# Patient Record
Sex: Female | Born: 1990 | Race: White | Hispanic: No | Smoking: Former smoker
Health system: Southern US, Community
[De-identification: ages and names within clinical notes are randomized; demographics above are authoritative.]

## PROBLEM LIST (undated history)

## (undated) DIAGNOSIS — F909 Attention-deficit hyperactivity disorder, unspecified type: Secondary | ICD-10-CM

## (undated) DIAGNOSIS — F419 Anxiety disorder, unspecified: Secondary | ICD-10-CM

## (undated) HISTORY — DX: Attention-deficit hyperactivity disorder, unspecified type: F90.9

## (undated) HISTORY — DX: Anxiety disorder, unspecified: F41.9

## (undated) HISTORY — PX: APPENDECTOMY: SHX54

---
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
## 2008-03-16 ENCOUNTER — Ambulatory Visit: Payer: Self-pay | Admitting: Family Medicine

## 2008-10-12 ENCOUNTER — Ambulatory Visit: Payer: Self-pay | Admitting: Obstetrics & Gynecology

## 2008-10-12 ENCOUNTER — Encounter: Payer: Self-pay | Admitting: Obstetrics & Gynecology

## 2008-10-12 LAB — CONVERTED CEMR LAB
Chlamydia, DNA Probe: NEGATIVE
GC Probe Amp, Genital: NEGATIVE

## 2009-03-22 ENCOUNTER — Emergency Department: Payer: Self-pay | Admitting: Emergency Medicine

## 2009-03-23 ENCOUNTER — Emergency Department: Payer: Self-pay | Admitting: Emergency Medicine

## 2009-12-03 ENCOUNTER — Ambulatory Visit: Payer: Self-pay | Admitting: Obstetrics & Gynecology

## 2009-12-03 LAB — CONVERTED CEMR LAB: Chlamydia, DNA Probe: POSITIVE — AB

## 2009-12-04 ENCOUNTER — Encounter: Payer: Self-pay | Admitting: Obstetrics & Gynecology

## 2009-12-04 LAB — CONVERTED CEMR LAB: Yeast Wet Prep HPF POC: NONE SEEN

## 2010-08-22 ENCOUNTER — Ambulatory Visit: Payer: Self-pay | Admitting: Obstetrics & Gynecology

## 2010-09-12 ENCOUNTER — Emergency Department: Payer: Self-pay | Admitting: Emergency Medicine

## 2010-09-24 NOTE — Assessment & Plan Note (Signed)
NAMEOSHA, RANE             ACCOUNT NO.:  [id number]   MEDICAL RECORD NO.:  [id number]          PATIENT TYPE:  POB   LOCATION:  [facility] at [facility]         FACILITY:  [facility]   PHYSICIAN:  Allie Bossier, MD        DATE OF BIRTH:  May 03, 1991   DATE OF SERVICE:  10/12/2008                                  CLINIC NOTE   Dawn Martinez is a 20 year old single white gravida 0, who is here for her  annual exam.  She has no particular complaints.  She has begun a new  sexual relationship with her current boyfriend for the last 3 months.  She is using condoms and withdrawal condoms and withdrawal for birth  control, but she would like to restart Lo/Ovral (she used this in the  past with without any complications).   PAST MEDICAL HISTORY:  None.   PAST SURGICAL HISTORY:  None.   REVIEW OF SYSTEMS:  As above plus she is a sophomore at [employer].  She has her period monthly and they last 3-4 days,  they are not painful.   FAMILY HISTORY:  Negative for breast and GYN cancer, but maternal  grandfather had colon cancer.   ALLERGIES:  PHENERGAN causes her to hallucinate and Ceclor causes an  unknown reaction.  She took it last as a child.  No LATEX allergies.   MEDICATIONS:  None.   PHYSICAL EXAMINATION:  VITAL SIGNS:  Weight 112 pounds, height 5 feet 1  inches, blood pressure 104/62, pulse 57.  HEENT:  Normal.  BREASTS:  Normal bilaterally.  HEART:  Regular rate and rhythm.  LUNGS:  Clear to auscultation bilaterally.  ABDOMEN:  Benign scaphoid.  No hepatosplenomegaly.  EXTERNAL GENITALIA:  Shaved, no lesions.  Cervix nulliparous, no  lesions.  Normal discharge.  Uterus normal size, retroverted, nontender.  Adnexa nontender.  No masses.   ASSESSMENT AND PLAN:  Annual exam.  I have checked cervical cultures.  She will resume Pap smear screening at age 71 and I will began Lo/Ovral  with her next period.  She will come in for a blood pressure check in  approximately 4  weeks.      Allie Bossier, MD     MCD/MEDQ  D:  10/12/2008  T:  10/13/2008  Job:  [id number]

## 2010-09-24 NOTE — Assessment & Plan Note (Signed)
Dawn Martinez, Dawn Martinez             ACCOUNT NO.:  [id number]   MEDICAL RECORD NO.:  [id number]          PATIENT TYPE:  POB   LOCATION:  [facility] at [facility]         FACILITY:  [facility]   PHYSICIAN:  Jaynie Collins, MD     DATE OF BIRTH:  [birth year]   DATE OF SERVICE:  12/03/2009                                  CLINIC NOTE   CHIEF COMPLAINT:  Dyspareunia and bleeding after intercourse.   HISTORY OF PRESENT ILLNESS:  The patient is a 20 year old gravida 0 who  is here today with a report of dyspareunia and also postcoital bleeding.  The patient does report having abdominal cramping, especially around her  period every month.  This cramping has been alleviated in the past by  using Ponstel and she also says she gets Vicodin from her father which  also helps with her pain.  The patient does also report increased during  intercourse in all positions with her current boyfriend.  She does say  that her boyfriend is well in doubt and he feels like this could be part  of the problem however.  She has had 8 lifetime partners and she feels  that she had other partners that were similarly in doubt, but without  any problems.  She is also complaining a vaginal dryness but has been  using some lubrication to help with this.  The patient wants to have  pain medication to help with her pain during intercourse.  She wants  evaluation for the bleeding after intercourse or during intercourse and  also wants a prescription for birth control pills.  She denies any other  complaints.  Of note, the patient's history is remarkable for using  cigarettes.  She smokes one pack a day and she does admit to a history  of using crack in the past.  Her last Pap smear was in 2010 which was  negative.  She denies any sexually transmitted infections.   PHYSICAL EXAMINATION:  Blood pressure is 104/74, pulse 78, weight 101  pounds, height 5 feet.  In general, no apparent distress.  Abdomen is  soft, nontender,  nondistended.  Pelvic, normal external female  genitalia.  The patient is exquisitely tender on examination of her  introitus area.  She does point to her right introitus area and points  to a hymenal remnant as the area of her pain.  There is no erythema, no  fissure, no lesions seen and she just seems very uncomfortable on  examination.  On speculum exam, she has pink, well-rugated vagina.  No  lesions seen.  Normal discharge.  A sample was taken to rule out  vaginitis.  Her cervical exam was remarkable for some erythema, was  tender to touch.  Sample was taken for gonorrhea and Chlamydia testing.  The patient does say that the majority of her pain is on the outside on  the introitus area.  A swab was taken to rule out herpes simplex virus  and also a Pap smear was obtained given the appearance of her cervix.  Bimanual exam was exquisitely tender, but no abnormal masses were  palpated.   ASSESSMENT AND PLAN:  The patient is a 20 year old gravida 0  here for  evaluation for dyspareunia, postcoital bleeding, and dysmenorrhea.  The  patient was told that there could be different etiologies for her pain,  but at this point vaginitis and serositis needs to be ruled out.  A Pap  smear is also pending.  We will also follow up on the results.  Given  her pattern of bleeding, she also might have a history of endometriosis.  The patient was told that the only way to diagnose this will be through  laparoscopy and this will be a consideration if her symptoms continue.  As management for her pain, the patient was given a prescription for  Voltaren XR which is a strong antiinflammatory agent.  She says she does  one birth control, she was given a pack of LoSeasonique.  The patient is  not interested in having continuous OCPs of periods 4 times a year, she  wants to have a monthly period, so she was instructed on how to take 3  weeks of this and not take anything for a week and then start it again   until she completes the pack.  She was told that she cannot take only  the active pills of the pack until she gets to the end of the pack.  The  patient verbalized understanding of plan.  We will follow up on all  other tests that were done today and the patient was told she will be  contacted with any abnormal results.  In the meantime, she was told to  take her Voltaren as needed for pain and to call or come back in for  further evaluation.  Of note, the patient was very upset at the end of  the encounter as she expected a narcotic prescription for her symptoms.  She was told that the best way to treat it will be to use an  antiinflammatory agent and her affect was also very concerning at the  end of this encounter.  If she does come back for reevaluation, this  issue might need to be readdressed.           ______________________________  Jaynie Collins, MD     UA/MEDQ  D:  12/03/2009  T:  12/04/2009  Job:  [id number]

## 2012-09-02 LAB — HM PAP SMEAR: HM Pap smear: NEGATIVE

## 2012-09-05 LAB — COMPREHENSIVE METABOLIC PANEL
Calcium, Total: 8.7 mg/dL (ref 8.5–10.1)
EGFR (African American): >60
EGFR (Non-African Amer.): >60
Osmolality: 282 (ref 275–301)
SGOT(AST): 18 U/L (ref 15–37)
SGPT (ALT): 20 U/L (ref 12–78)
Sodium: 141 mmol/L (ref 136–145)
Total Protein: 6.9 g/dL (ref 6.4–8.2)

## 2012-09-05 LAB — LIPASE, BLOOD: Lipase: 89 U/L (ref 73–393)

## 2012-09-05 LAB — CBC
HCT: 37.4 % (ref 35.0–47.0)
MCH: 29.9 pg (ref 26.0–34.0)
Platelet: 238 10*3/uL (ref 150–440)

## 2012-09-06 ENCOUNTER — Observation Stay: Payer: Self-pay | Admitting: Surgery

## 2012-09-06 LAB — URINALYSIS, COMPLETE
Bacteria: NONE SEEN
Bilirubin,UR: NEGATIVE
Blood: NEGATIVE
Glucose,UR: NEGATIVE mg/dL (ref 0–75)
Nitrite: NEGATIVE

## 2012-09-06 LAB — PREGNANCY, URINE: Pregnancy Test, Urine: NEGATIVE m[IU]/mL

## 2012-09-07 LAB — PATHOLOGY REPORT

## 2014-03-23 IMAGING — CT CT ABD-PELV W/ CM
1 of 2 series · 15 of 32 positions shown, 19 images · IV contrast (isovue)
Comparison: none

REASON FOR EXAM: (1) right sided abd pain with elevated WBC eval appy;
(2) right sided abd pain w
COMMENTS:

PROCEDURE:     CT  - CT ABDOMEN / PELVIS  W  - September 06, 2012  [DATE]
RESULT:     Comparison:  None
TECHNIQUE: Multiple axial images of the abdomen and pelvis were performed
from the lung bases to the pubic symphysis, without p.o. contrast and with
85 mL of Isovue 300 intravenous contrast.

[Series 2: 3mm soft tissue · axial · 0.60mm/px · z∈[-453,-60]mm · 15 of 143 slices shown, 19 images]
[im 6/143  soft-tissue]
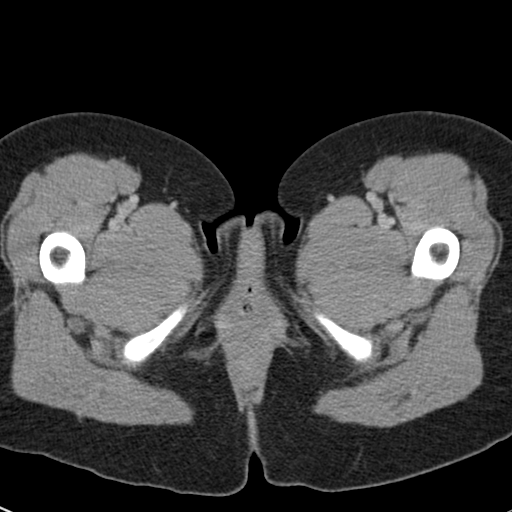
[im 6/143  bone]
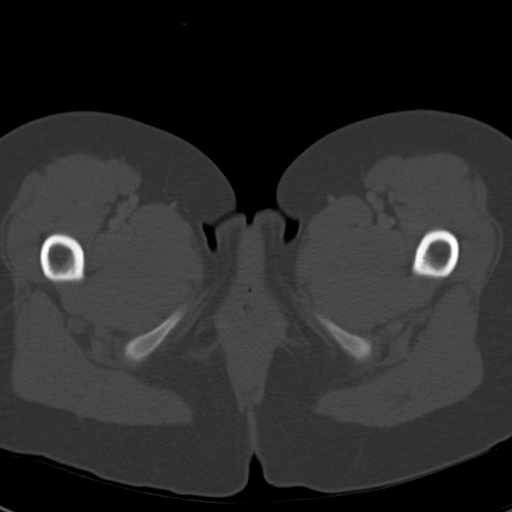
[im 18/143  soft-tissue]
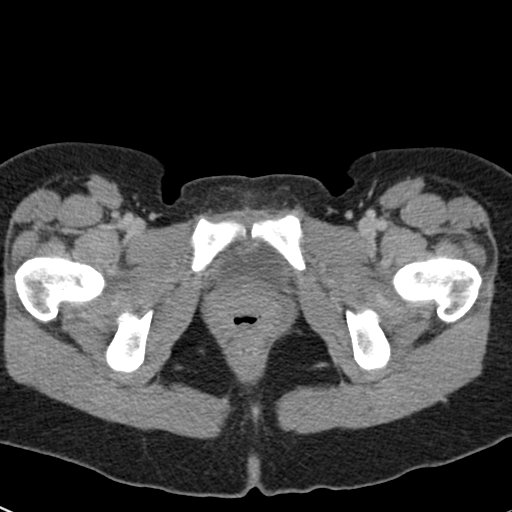
[im 30/143  soft-tissue]
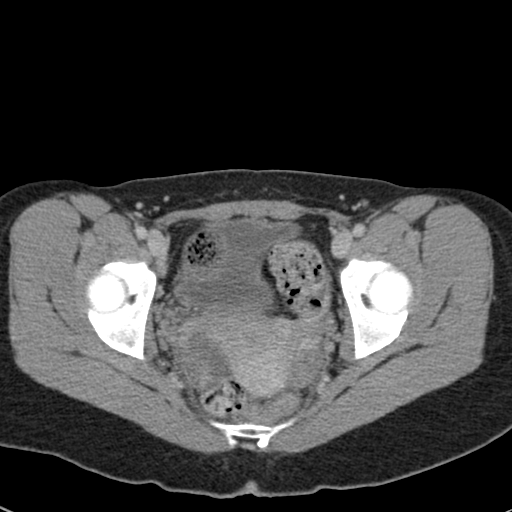
[im 42/143  soft-tissue]
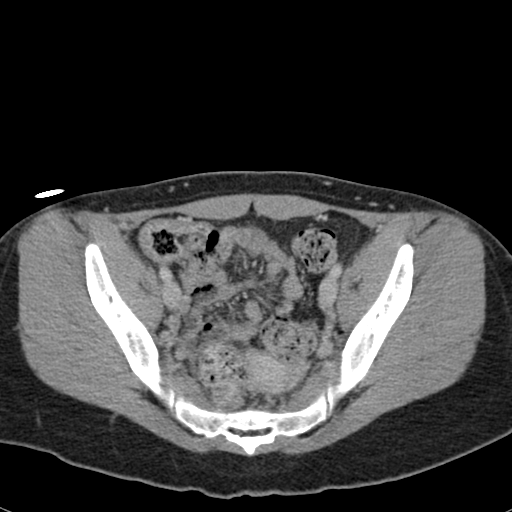
[im 48/143  soft-tissue]
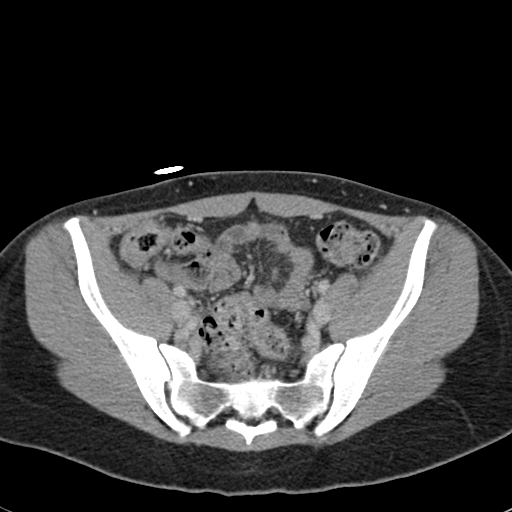
[im 60/143  soft-tissue]
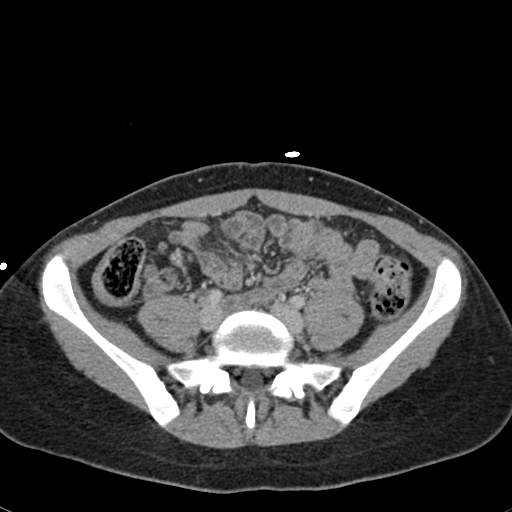
[im 72/143  soft-tissue]
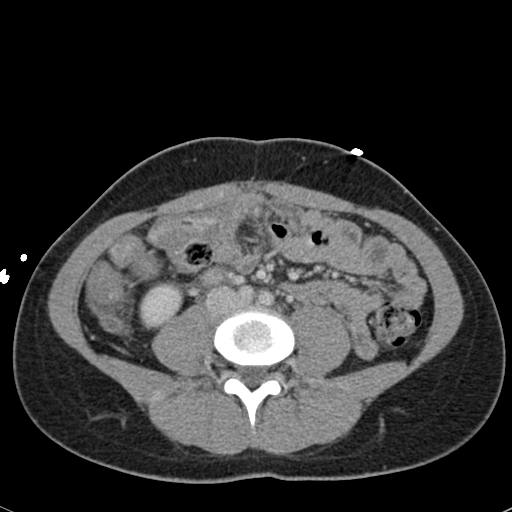
[im 83/143  soft-tissue]
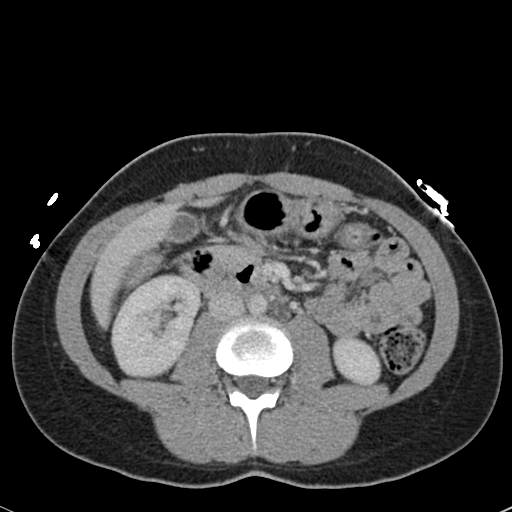
[im 95/143  soft-tissue]
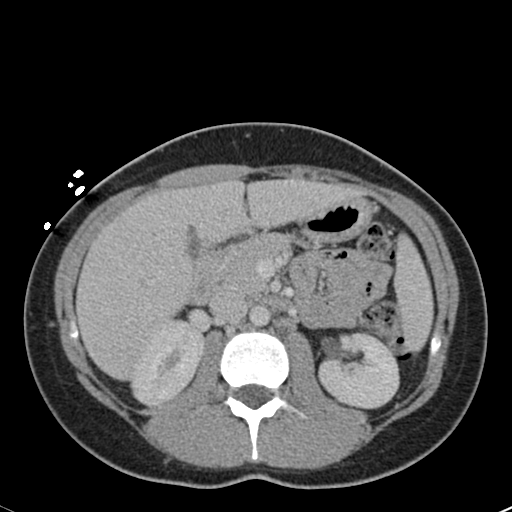
[im 95/143  bone]
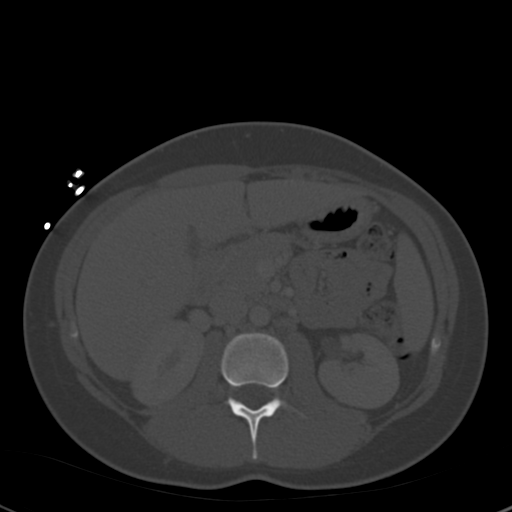
[im 101/143  soft-tissue]
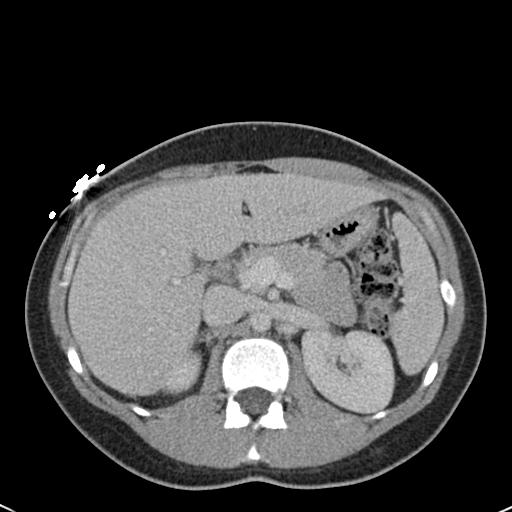
[im 113/143  soft-tissue]
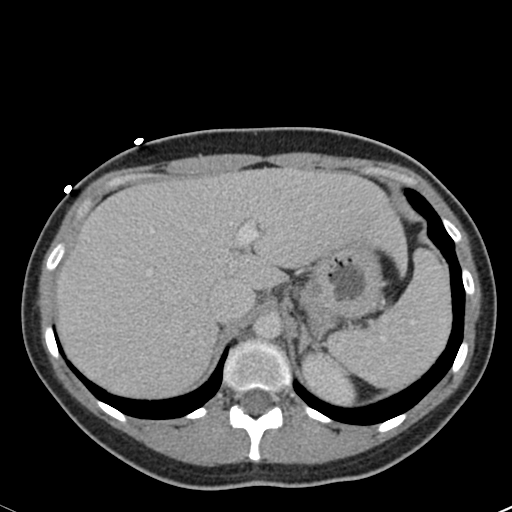
[im 119/143  lung]
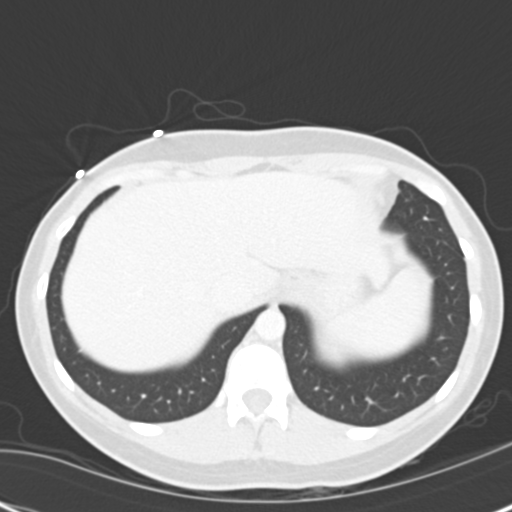
[im 125/143  soft-tissue]
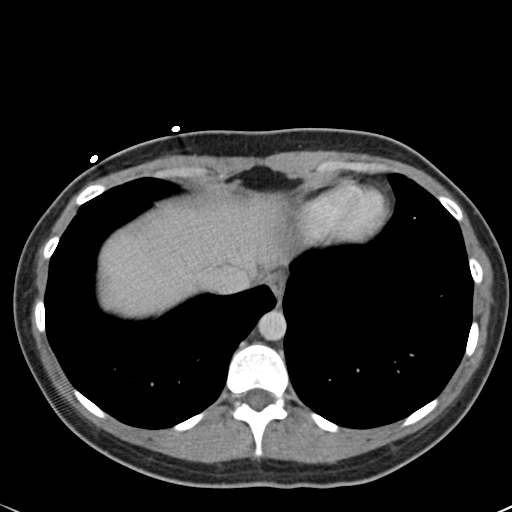
[im 125/143  lung]
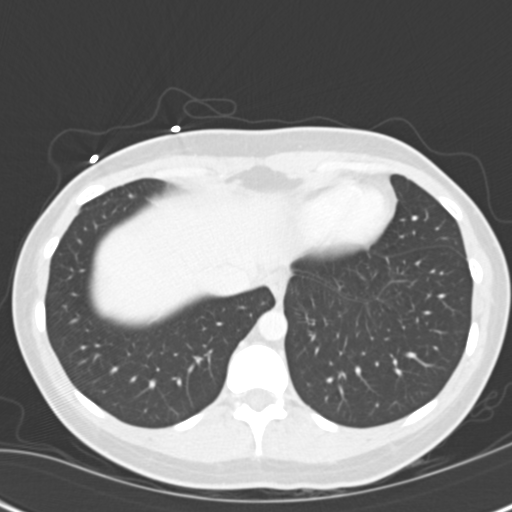
[im 131/143  lung]
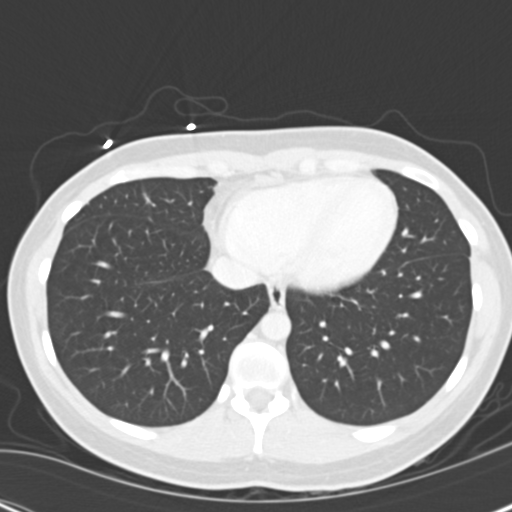
[im 137/143  soft-tissue]
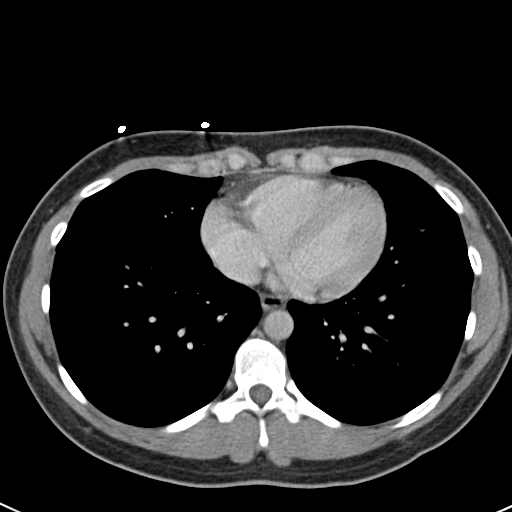
[im 137/143  lung]
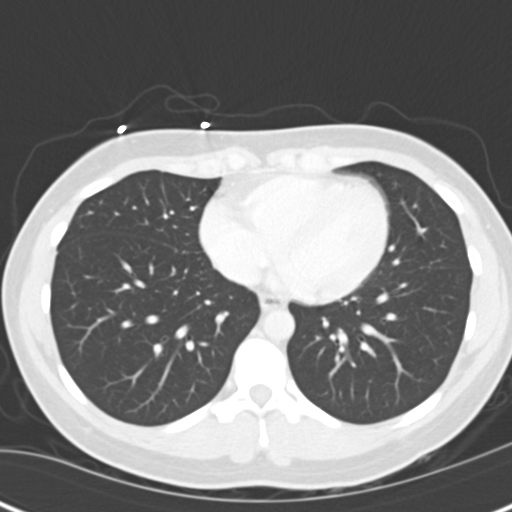

[15 of 32 positions shown; findings below may reference images not displayed]

FINDINGS: The liver, gallbladder, spleen, adrenals, and pancreas are unremarkable. The
kidneys enhance normally.

The small and large bowel are normal in caliber. There is a trace amount of
free fluid in the pelvis, which is likely physiologic. The appendix is
enlarged and fluid-filled. It measures 11 mm in diameter. There is moderate
adjacent inflammatory stranding. The findings are consistent with acute
appendicitis. Mild prominence of the adjacent ascending colon wall is likely
reactive.

No aggressive lytic or sclerotic osseous lesions are identified.
IMPRESSION: Acute appendicitis.

## 2014-09-01 ENCOUNTER — Emergency Department: Admit: 2014-09-01 | Disposition: A | Discharge status: Home / Self Care | Payer: Self-pay | Admitting: Student

## 2014-09-01 LAB — CBC WITH DIFFERENTIAL/PLATELET
Basophil #: 0.1 10*3/uL (ref 0.0–0.1)
Basophil %: 0.6 %
Eosinophil #: 0.1 10*3/uL (ref 0.0–0.7)
Eosinophil %: 0.8 %
HCT: 37.2 % (ref 35.0–47.0)
HGB: 12.4 g/dL (ref 12.0–16.0)
LYMPHS PCT: 21.5 %
Lymphocyte #: 3.9 10*3/uL — ABNORMAL HIGH (ref 1.0–3.6)
MCH: 29 pg (ref 26.0–34.0)
MCHC: 33.2 g/dL (ref 32.0–36.0)
MCV: 87 fL (ref 80–100)
Monocyte #: 1.3 x10 3/mm — ABNORMAL HIGH (ref 0.2–0.9)
Monocyte %: 7.3 %
NEUTROS ABS: 12.6 10*3/uL — AB (ref 1.4–6.5)
Neutrophil %: 69.8 %
PLATELETS: 293 10*3/uL (ref 150–440)
RBC: 4.26 10*6/uL (ref 3.80–5.20)
RDW: 12.9 % (ref 11.5–14.5)
WBC: 18.1 10*3/uL — AB (ref 3.6–11.0)

## 2014-09-01 NOTE — Op Note (Signed)
PATIENT NAME:  Dawn Martinez, Dawn Martinez MR#:  [id number] DATE OF BIRTH:  [birth year]  DATE OF PROCEDURE: 09/06/2012.   OPERATION PERFORMED: Laparoscopic appendectomy.   PREOPERATIVE DIAGNOSIS: Early acute appendicitis.   POSTOPERATIVE DIAGNOSIS: Early acute appendicitis.   SURGEON: Claude MangesWilliam F Macil Crady, M.D.   ANESTHESIA: General.   PROCEDURE IN DETAIL: The patient was placed supine on the operating room table and prepped and draped in the usual sterile fashion. A 2 cm supraumbilical midline incision was made, carried down through the linea alba and a Hassan cannula was introduced amidst horizontal mattress sutures of 0 Vicryl. A 15 mmHg CO2 pneumoperitoneum was created. There was a small amount of free pus in the pelvis. The cecum was mobile, and the appendix was in a bizarre location, having a mesoappendix attachment to the ascending colon more than halfway up to the hepatic flexure. The area of inflammation in the appendix was at the tip. The mesoappendix was taken down with the Harmonic scalpel very carefully in order to not create any thermal injury to the nearby ascending colon, and the appendectomy was performed with an Endo GIA stapling device. The appendix was placed in an EndoCatch bag and extracted from the abdomen via the epigastric port.   The right upper quadrant and right gutter were irrigated with warm normal saline. This was all suctioned clear from the pelvis, and the peritoneum was desufflated and decannulated and the linea alba was closed with a running 0 PDS suture and the previously-placed Vicryls were tied, one to another, and all 3 skin sites were closed with subcuticular 5-0 Monocryl and suture strips. The patient tolerated the procedure well. There were no complications.    ____________________________ Claude MangesWilliam F. Adaysha Dubinsky, MD wfm:dm D: 09/06/2012 12:07:45 ET T: 09/06/2012 12:49:45 ET JOB#: [id number]  cc: Claude MangesWilliam F. Kursten Kruk, MD, <Dictator> Claude MangesWILLIAM F Eleftherios Dudenhoeffer MD ELECTRONICALLY  SIGNED 09/12/2012 14:44

## 2014-09-01 NOTE — H&P (Signed)
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   Past History PMH none PSH none   Past Med/Surgical Hx:  Denies medical history:   Denies surgical history.:   ALLERGIES:  Phenergan: Alt Ment Status  Ceclor: Unknown  Family and Social History:  Family History Non-Contributory   Social History positive  tobacco, positive ETOH, CNA   + Tobacco Current (within 1 year)   Place of Living Home   Review of Systems:  Fever/Chills No   Cough No   Abdominal Pain Yes   Diarrhea No   Constipation No   Nausea/Vomiting Yes   SOB/DOE No   Chest Pain No   Dysuria No   Tolerating Diet Yes  Nauseated  Vomiting  ate last night, reg diet but vomiting later this eve   Physical Exam:  GEN no acute distress   HEENT pink conjunctivae   NECK supple   RESP normal resp effort  clear BS  no use of accessory muscles   CARD regular rate   ABD positive tenderness  soft  RLQ tenderness, no guarding no rebound no percussion tenderness, no CVA tenderness   LYMPH negative neck   EXTR negative edema   SKIN normal to palpation   PSYCH alert, A+O to time, place, person, good insight   Lab Results: Hepatic:  27-Apr-14 22:34   Bilirubin, Total 0.3  Alkaline Phosphatase 53  SGPT (ALT) 20  SGOT (AST) 18  Total Protein, Serum 6.9  Albumin, Serum 4.2  Routine Chem:  27-Apr-14 22:34   Lipase 89 (Result(s) reported on 05 Sep 2012 at 11:46PM.)  Glucose, Serum  108  BUN 12  Creatinine (comp) 0.65  Sodium, Serum 141  Potassium, Serum  3.2  Chloride, Serum 107  CO2, Serum  20  Calcium (Total), Serum 8.7  Osmolality (calc) 282  eGFR (African American) >60  eGFR (Non-African American) >60 (eGFR values <50m/min/1.73 m2 may be an indication of chronic kidney disease (CKD). Calculated eGFR is useful in  patients with stable renal function. The eGFR calculation will not be reliable in acutely ill patients when serum creatinine is changing rapidly. It is not useful in  patients on dialysis. The eGFR calculation may not be applicable to patients at the low and high extremes of body sizes, pregnant women, and vegetarians.)  Anion Gap 14  Routine UA:  28-Apr-14 00:27   Color (UA) Yellow  Clarity (UA) Hazy  Glucose (UA) Negative  Bilirubin (UA) Negative  Ketones (UA) 1+  Specific Gravity (UA) 1.021  Blood (UA) Negative  pH (UA) 8.0  Protein (UA) Negative  Nitrite (UA) Negative  Leukocyte Esterase (UA) Negative (Result(s) reported on 06 Sep 2012 at 12:52AM.)  RBC (UA) 2 /HPF  WBC (UA) 5 /HPF  Bacteria (UA) NONE SEEN  Epithelial Cells (UA) 2 /HPF  Mucous (UA) PRESENT  Budding Yeast (UA) PRESENT  Amorphous Crystal (UA) PRESENT (Result(s) reported on 06 Sep 2012 at 12:52AM.)  Routine Sero:  28-Apr-14 00:27   Pregnancy Test, Urine NEGATIVE (The results of the qualitative urine HCG (Pregnancy Test) should be evaluated in light of other clinical information.  There are limitations to the test which, in certain clinical situations, may result in a false positive or negative result. Thehigh dose hook effect can occur in urine samples with extremely high HCG concentrations.  This effect can produce a negative result in certain situations. It is suggested that results of the qualitative HCG be confirmed by an alternate methodology, such as the quantitative serum beta HCG test.)  Routine Hem:  27-Apr-14 22:34   WBC (CBC)  24.6  RBC (CBC) 4.28  Hemoglobin (CBC) 12.8  Hematocrit (CBC) 37.4  Platelet Count (CBC) 238 (Result(s) reported on 05 Sep 2012 at 10:52PM.)  MCV 87  MCH 29.9  MCHC 34.2  RDW 12.5    Assessment/Admission Diagnosis CT personally rev'd, appendicitis likely early appy given onset of sx and little peritoneal signs may be retrocecal K3.2, will replace K and plan lap  appy later this am by Dr Leanora Cover risks and options rev'd, see dictation agrees with plan abx and VTE prophylaxis in place   Electronic Signatures: Florene Glen (MD)  (Signed 28-Apr-14 02:28)  Authored: CHIEF COMPLAINT and HISTORY, PAST MEDICAL/SURGIAL HISTORY, ALLERGIES, FAMILY AND SOCIAL HISTORY, REVIEW OF SYSTEMS, PHYSICAL EXAM, LABS, ASSESSMENT AND PLAN   Last Updated: 28-Apr-14 02:28 by Florene Glen (MD)

## 2014-09-01 NOTE — H&P (Signed)
PATIENT NAME:  Dawn Martinez, FRIEDLANDER MR#:  [id number] DATE OF BIRTH:  16-Sep-1990  DATE OF ADMISSION:  09/06/2012  CHIEF COMPLAINT: Right side pain.   HISTORY OF PRESENT ILLNESS: This is a patient with acute onset of right-sided and right lower quadrant pain that started approximately 20 to 30 hours. 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 It has been gradually worsening but is much better now that she has had some pain medications. She was given IV Toradol. She had multiple emeses as well, but she ate dinner last night at approximately 5:30 and had a snack at break prior to the onset of her symptoms.   She had normal bowel movements today and has not had any fevers or chills, has never had an episode like this before, denies dysuria or hematuria.   PAST MEDICAL HISTORY: None.   PAST SURGICAL HISTORY: None.   ALLERGIES: CECLOR CAUSING A RASH WHEN SHE WAS APPROXIMATELY 24 YEARS OLD AND PHENERGAN WHICH CAUSES HER TO HALLUCINATE.   REVIEW OF SYSTEMS: 10 system review is performed and negative with the exception of that mentioned in the history of present illness.   MEDICATIONS: None.   FAMILY HISTORY: Noncontributory.   PHYSICAL EXAMINATION: GENERAL: Healthy female patient. She is walking around the room without pain or tenderness (on questioning she has no increased pain while walking).  VITAL SIGNS: Temperature of 100, pulse of 79, respirations 22, blood pressure 92/79, 100% room air sat. Pain scale was 10 when taken at 2224, not re-questioned except by me. See history of present illness.  HEENT: No scleral icterus.   NECK: No palpable neck nodes.  CHEST: Clear to auscultation.  CARDIAC: Regular rate and rhythm.  ABDOMEN: Soft. There is no guarding, no rebound, and very minimal if any percussion tenderness in the right lower quadrant. No CVA tenderness. Her maximal tenderness is in the right lower quadrant, somewhat lateral to McBurney's point,  again without guarding, rebound or percussion tenderness of any significance.  EXTREMITIES: Without edema.  NEUROLOGIC: Grossly intact.  INTEGUMENT: No jaundice.   LABORATORY, DIAGNOSTIC, AND RADIOLOGICAL DATA: CT scan is personally reviewed suggesting appendicitis possibly in a retrocecal position.   Laboratory values demonstrate a negative per pregnancy test, normal urinalysis, lipase is normal. White blood cell count is 24.6, hemoglobin and hematocrit of 12.8 and 37.4 and a platelet count 238. Potassium is depressed at 3.2, CO2 is 20.   ASSESSMENT AND PLAN: This is a patient with likely appendicitis both by history, physical and CT scan. Likely early appendicitis given the patient's lack of peritoneal findings and the onset of her symptoms.  IV antibiotics have been instituted by Dr. Zenda Alpers in the ER who I spoke to personally. She will be given VTE prophylaxis, admitted and her potassium will need to be replaced prior to general anesthesia. I have instituted orders concerning replacement of her potassium to 3.2 and plan a laparoscopic appendectomy later this morning by Dr. Anda Kraft. The rationale for surgery has been discussed. The options of observation have been reviewed and the risks of bleeding, infection, recurrence, ruptured appendix and the ramifications of such and the risks of an open procedure and abscess have all been reviewed with her and her family. They understood and agreed to proceed.    ____________________________ Adah Salvage Excell Seltzer, MD rec:kb D: 09/06/2012 02:32:29 ET T: 09/06/2012 04:50:56 ET JOB#: [id number]  cc: Adah Salvage. Excell Seltzer, MD, <Dictator> Lattie Haw MD ELECTRONICALLY  SIGNED 09/06/2012 6:44

## 2014-09-02 LAB — URINALYSIS, COMPLETE
BILIRUBIN, UR: NEGATIVE
Glucose,UR: NEGATIVE mg/dL (ref 0–75)
Ketone: NEGATIVE
LEUKOCYTE ESTERASE: NEGATIVE
Nitrite: NEGATIVE
PH: 5 (ref 4.5–8.0)
Specific Gravity: 1.026 (ref 1.003–1.030)

## 2014-09-02 LAB — COMPREHENSIVE METABOLIC PANEL
ALK PHOS: 56 U/L
Albumin: 4.3 g/dL
Anion Gap: 7 (ref 7–16)
BILIRUBIN TOTAL: 0.4 mg/dL
BUN: 10 mg/dL
CALCIUM: 8.9 mg/dL
CO2: 27 mmol/L
Chloride: 103 mmol/L
Creatinine: 0.69 mg/dL
EGFR (African American): >60
Glucose: 97 mg/dL
Potassium: 3.3 mmol/L — ABNORMAL LOW
SGOT(AST): 22 U/L
SGPT (ALT): 17 U/L
Sodium: 137 mmol/L
Total Protein: 7.3 g/dL

## 2014-09-02 LAB — WET PREP, GENITAL

## 2014-09-02 LAB — LIPASE, BLOOD: LIPASE: 33 U/L

## 2014-09-02 LAB — GC/CHLAMYDIA PROBE AMP

## 2014-09-02 LAB — TROPONIN I: Troponin-I: <0.03 ng/mL

## 2014-11-03 ENCOUNTER — Ambulatory Visit (INDEPENDENT_AMBULATORY_CARE_PROVIDER_SITE_OTHER): Payer: 59 | Admitting: Family Medicine

## 2014-11-03 ENCOUNTER — Encounter: Payer: Self-pay | Admitting: Family Medicine

## 2014-11-03 VITALS — BP 104/62 | HR 76 | Temp 98.3°F | Resp 16 | Ht 60.0 in | Wt 154.0 lb

## 2014-11-03 DIAGNOSIS — R41840 Attention and concentration deficit: Secondary | ICD-10-CM | POA: Diagnosis not present

## 2014-11-03 DIAGNOSIS — F411 Generalized anxiety disorder: Secondary | ICD-10-CM | POA: Insufficient documentation

## 2014-11-03 DIAGNOSIS — F329 Major depressive disorder, single episode, unspecified: Secondary | ICD-10-CM | POA: Insufficient documentation

## 2014-11-03 DIAGNOSIS — F419 Anxiety disorder, unspecified: Secondary | ICD-10-CM

## 2014-11-03 DIAGNOSIS — IMO0002 Reserved for concepts with insufficient information to code with codable children: Secondary | ICD-10-CM | POA: Insufficient documentation

## 2014-11-03 DIAGNOSIS — F3281 Premenstrual dysphoric disorder: Secondary | ICD-10-CM | POA: Insufficient documentation

## 2014-11-03 DIAGNOSIS — J302 Other seasonal allergic rhinitis: Secondary | ICD-10-CM | POA: Insufficient documentation

## 2014-11-03 MED ORDER — CLONAZEPAM 0.5 MG PO TABS: ORAL_TABLET | ORAL | Status: DC

## 2014-11-03 NOTE — Patient Instructions (Signed)
We will call you with referral information

## 2014-11-03 NOTE — Progress Notes (Signed)
Subjective:     Patient ID: Dawn Martinez, female   DOB: 01-Nov-1990, 24 y.o.   MRN: [id number]  HPI  Chief Complaint  Patient presents with  . Anxiety    Pt requesting refill on Clonazepam  . 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 Pt was on Ritalin as a child.  States she is unable to focus/concentrate well and is fidgety. This has been noticed by her instructors. Reports childhood hx of ADD. Current clonazepam use is 1/2 at bedtime. States medication makes her less irritable and anxious.   Review of Systems  Psychiatric/Behavioral:       Prior trials on sertraline, buspirone, Wellbutrin, and Lexapro have not been well tolerated per her report.       Objective:   Physical Exam  Constitutional: She appears well-developed and well-nourished. No distress.  Psychiatric: Her mood appears not anxious. She does not exhibit a depressed mood.       Assessment:    1. GAD (generalized anxiety disorder) - clonazePAM (KLONOPIN) 0.5 MG tablet; 1/2 pill twice daily as needed for anxiety  Dispense: 30 tablet; Refill: 2  2. Poor concentration - Ambulatory referral to Psychology for ADD testing.    Plan:       Further f/u pending testing results.

## 2014-12-14 ENCOUNTER — Ambulatory Visit: Payer: 59 | Admitting: Family Medicine

## 2014-12-19 ENCOUNTER — Other Ambulatory Visit: Payer: Self-pay | Admitting: Family Medicine

## 2014-12-19 ENCOUNTER — Encounter: Payer: Self-pay | Admitting: Family Medicine

## 2014-12-19 ENCOUNTER — Ambulatory Visit (INDEPENDENT_AMBULATORY_CARE_PROVIDER_SITE_OTHER): Payer: 59 | Admitting: Family Medicine

## 2014-12-19 ENCOUNTER — Telehealth: Payer: Self-pay | Admitting: Family Medicine

## 2014-12-19 VITALS — BP 128/70 | HR 60 | Temp 98.3°F | Resp 16 | Wt 154.4 lb

## 2014-12-19 DIAGNOSIS — R300 Dysuria: Secondary | ICD-10-CM | POA: Diagnosis not present

## 2014-12-19 DIAGNOSIS — F909 Attention-deficit hyperactivity disorder, unspecified type: Secondary | ICD-10-CM

## 2014-12-19 DIAGNOSIS — Z13220 Encounter for screening for lipoid disorders: Secondary | ICD-10-CM | POA: Diagnosis not present

## 2014-12-19 DIAGNOSIS — Z131 Encounter for screening for diabetes mellitus: Secondary | ICD-10-CM

## 2014-12-19 LAB — POCT URINALYSIS DIPSTICK
Bilirubin, UA: NEGATIVE
GLUCOSE: NEGATIVE
Ketones, UA: NEGATIVE
Leukocytes, UA: NEGATIVE
Nitrite, UA: NEGATIVE
PROTEIN UA: NEGATIVE
Spec Grav, UA: 1.015
Urobilinogen, UA: 0.2
pH, UA: 6.5

## 2014-12-19 MED ORDER — ATOMOXETINE HCL 40 MG PO CAPS: 40.0000 mg | ORAL_CAPSULE | Freq: Every day | ORAL | Status: DC

## 2014-12-19 MED ORDER — METHYLPHENIDATE HCL 5 MG PO TABS: 5.0000 mg | ORAL_TABLET | Freq: Two times a day (BID) | ORAL | Status: DC

## 2014-12-19 NOTE — Telephone Encounter (Signed)
Pt called back and since she is going to have to pick up the rX in person.  She would like to have the RX before the days end because she is going out of town tomorrow and would like to have it.  Please let her know when she can pick up RX.  Her call back is [phone]  Thanks, Barth Kirks

## 2014-12-19 NOTE — Telephone Encounter (Signed)
Pt called back saying to go ahead with the ritalin Rx.  Mom wants her to try the ritalin first.  Thanks teri

## 2014-12-19 NOTE — Progress Notes (Signed)
Subjective:     Patient ID: Dawn Martinez, female   DOB: [birth year], 24 y.o.   MRN: [id number]  HPI  Chief Complaint  Patient presents with  . Follow-up    Patient is present in office today for follow up on 11/03/14, patient was seen for anxiety and poor concentration and was referred to psych for ADD testing.  She is not sure she wishes to try a stimulant medication like Ritalin. I suggested it may worsen anxiety. She will look into Strattera. Due to ongoing issues with anxiety would consider psychiatry referral if not tolerated. Wishes also to update labs today.   Review of Systems  Genitourinary:       Wishes referral to urology, Ms. Overton, for chronic urinary urgency, frequency, and bladder spasms.       Objective:   Physical Exam  Constitutional: She appears well-developed and well-nourished. No distress.       Assessment:    1. Attention deficit hyperactivity disorder (ADHD), unspecified ADHD type: provided with copy of psychology evaluation c/w ADHD. - atomoxetine (STRATTERA) 40 MG capsule; Take 1 capsule (40 mg total) by mouth daily.  Dispense: 30 capsule; Refill: 0  2. Dysuria - Urine culture - Ambulatory referral to Urology - CBC with Differential/Platelet - POCT urinalysis dipstick  3. Screening for cholesterol level - Lipid panel  4. Screening for diabetes mellitus - Comprehensive metabolic panel    Plan:    F/u in 4 weeks if tolerating Strattera. She will call if she wishes an alternative medication/referral.

## 2014-12-19 NOTE — Telephone Encounter (Signed)
Please show me last note

## 2014-12-19 NOTE — Patient Instructions (Signed)
We will call you with lab results and urine culture results.

## 2014-12-20 NOTE — Telephone Encounter (Signed)
Nadine Counts I think you saw this patient.  Thanks [employer]

## 2014-12-21 ENCOUNTER — Telehealth: Payer: Self-pay

## 2014-12-21 LAB — LIPID PANEL
CHOL/HDL RATIO: 5.5 ratio — AB (ref 0.0–4.4)
CHOLESTEROL TOTAL: 144 mg/dL (ref 100–199)
HDL: 26 mg/dL — ABNORMAL LOW (ref >39)
LDL Calculated: 88 mg/dL (ref 0–99)
TRIGLYCERIDES: 152 mg/dL — AB (ref 0–149)
VLDL CHOLESTEROL CAL: 30 mg/dL (ref 5–40)

## 2014-12-21 LAB — COMPREHENSIVE METABOLIC PANEL
A/G RATIO: 1.9 (ref 1.1–2.5)
ALK PHOS: 55 IU/L (ref 39–117)
ALT: 16 IU/L (ref 0–32)
AST: 16 IU/L (ref 0–40)
Albumin: 4.6 g/dL (ref 3.5–5.5)
BUN / CREAT RATIO: 13 (ref 8–20)
BUN: 10 mg/dL (ref 6–20)
Bilirubin Total: 0.3 mg/dL (ref 0.0–1.2)
CO2: 20 mmol/L (ref 18–29)
CREATININE: 0.79 mg/dL (ref 0.57–1.00)
Calcium: 9.3 mg/dL (ref 8.7–10.2)
Chloride: 105 mmol/L (ref 97–108)
GFR, EST AFRICAN AMERICAN: 120 mL/min/{1.73_m2} (ref >59)
GFR, EST NON AFRICAN AMERICAN: 104 mL/min/{1.73_m2} (ref >59)
GLOBULIN, TOTAL: 2.4 g/dL (ref 1.5–4.5)
Glucose: 87 mg/dL (ref 65–99)
Potassium: 4.6 mmol/L (ref 3.5–5.2)
SODIUM: 141 mmol/L (ref 134–144)
Total Protein: 7 g/dL (ref 6.0–8.5)

## 2014-12-21 LAB — CBC WITH DIFFERENTIAL/PLATELET
Basophils Absolute: 0 10*3/uL (ref 0.0–0.2)
Basos: 0 %
EOS (ABSOLUTE): 0.1 10*3/uL (ref 0.0–0.4)
Eos: 1 %
Hematocrit: 38.2 % (ref 34.0–46.6)
Hemoglobin: 13.2 g/dL (ref 11.1–15.9)
Immature Grans (Abs): 0 10*3/uL (ref 0.0–0.1)
Immature Granulocytes: 0 %
LYMPHS ABS: 2.4 10*3/uL (ref 0.7–3.1)
LYMPHS: 38 %
MCH: 29.5 pg (ref 26.6–33.0)
MCHC: 34.6 g/dL (ref 31.5–35.7)
MCV: 85 fL (ref 79–97)
Monocytes Absolute: 0.5 10*3/uL (ref 0.1–0.9)
Monocytes: 7 %
NEUTROS ABS: 3.4 10*3/uL (ref 1.4–7.0)
Neutrophils: 54 %
Platelets: 329 10*3/uL (ref 150–379)
RBC: 4.48 x10E6/uL (ref 3.77–5.28)
RDW: 13.1 % (ref 12.3–15.4)
WBC: 6.3 10*3/uL (ref 3.4–10.8)

## 2014-12-21 LAB — URINE CULTURE: Organism ID, Bacteria: NO GROWTH

## 2014-12-21 NOTE — Telephone Encounter (Signed)
Unable to leave message, will try to call later.  Thanks, 

## 2014-12-21 NOTE — Telephone Encounter (Signed)
-----   Message from Anola Gurney, [place] sent at 12/21/2014  7:50 AM EDT ----- Labs are good. No growth on urine culture.

## 2014-12-21 NOTE — Telephone Encounter (Signed)
Advised pt as directed below, pt stated that she is still having some bladder spasms and urgency and stated that she will make an appointment to the urologist as advised in the last ov by PA.  Thanks,

## 2015-01-12 ENCOUNTER — Ambulatory Visit: Payer: 59 | Admitting: Obstetrics and Gynecology

## 2015-01-16 ENCOUNTER — Ambulatory Visit (INDEPENDENT_AMBULATORY_CARE_PROVIDER_SITE_OTHER): Payer: 59 | Admitting: Family Medicine

## 2015-01-16 ENCOUNTER — Encounter: Payer: Self-pay | Admitting: Family Medicine

## 2015-01-16 ENCOUNTER — Ambulatory Visit: Payer: 59 | Admitting: Family Medicine

## 2015-01-16 VITALS — BP 102/56 | HR 68 | Temp 98.0°F | Resp 14 | Wt 154.8 lb

## 2015-01-16 DIAGNOSIS — F411 Generalized anxiety disorder: Secondary | ICD-10-CM | POA: Diagnosis not present

## 2015-01-16 DIAGNOSIS — F909 Attention-deficit hyperactivity disorder, unspecified type: Secondary | ICD-10-CM | POA: Diagnosis not present

## 2015-01-16 DIAGNOSIS — R110 Nausea: Secondary | ICD-10-CM | POA: Diagnosis not present

## 2015-01-16 MED ORDER — CLONAZEPAM 0.5 MG PO TABS: ORAL_TABLET | ORAL | Status: DC

## 2015-01-16 MED ORDER — METHYLPHENIDATE HCL 5 MG PO TABS: 5.0000 mg | ORAL_TABLET | Freq: Two times a day (BID) | ORAL | Status: DC

## 2015-01-16 MED ORDER — ONDANSETRON HCL 8 MG PO TABS: 8.0000 mg | ORAL_TABLET | Freq: Three times a day (TID) | ORAL | Status: DC | PRN

## 2015-01-16 NOTE — Progress Notes (Signed)
zoSubjective:     Patient ID: Cristie Hem, female   DOB: 05-03-1991, 24 y.o.   MRN: [id number]  HPI  Chief Complaint  Patient presents with  . ADHD    Patient is present today to follow up from 12/19/14, patient was evaluated for ADHD and started on Ritalin. Patient reports since starting on medication she has noticed a positive change.   States she will take a dose in the middle to late AM and the second dose in the mid to late afternoon.Reports increased focus and less fidgety. Wishes to stay on current dose. Did get some nausea with initiation of mediation and wishes a few more Zofran. Current dose of clonazepam is 1/2 pill daily.   Review of Systems     Objective:   Physical Exam  Constitutional: She appears well-developed and well-nourished. No distress.  Psychiatric: She has a normal mood and affect. Her behavior is normal.  Does not appear to be anxious.       Assessment:    1. Attention deficit hyperactivity disorder (ADHD), unspecified ADHD type - methylphenidate (RITALIN) 5 MG tablet; Take 1 tablet (5 mg total) by mouth 2 (two) times daily. Take 30 minutes before a meal  Dispense: 60 tablet; Refill: 0  2. GAD (generalized anxiety disorder) - clonazePAM (KLONOPIN) 0.5 MG tablet; 1/2 pill twice daily as needed for anxiety  Dispense: 30 tablet; Refill: 2  3. Nausea - ondansetron (ZOFRAN) 8 MG tablet; Take 1 tablet (8 mg total) by mouth every 8 (eight) hours as needed for nausea or vomiting.  Dispense: 20 tablet; Refill: 0    Plan:    f/u in 4 weeks.

## 2015-01-16 NOTE — Patient Instructions (Signed)
Let me know how are doing on the current dose of Ritalin in 4 weeks.

## 2015-01-24 ENCOUNTER — Encounter: Payer: Self-pay | Admitting: Obstetrics and Gynecology

## 2015-01-24 ENCOUNTER — Ambulatory Visit (INDEPENDENT_AMBULATORY_CARE_PROVIDER_SITE_OTHER): Payer: 59 | Admitting: Obstetrics and Gynecology

## 2015-01-24 VITALS — BP 114/78 | HR 74 | Resp 99 | Ht 60.0 in | Wt 151.7 lb

## 2015-01-24 DIAGNOSIS — R350 Frequency of micturition: Secondary | ICD-10-CM

## 2015-01-24 DIAGNOSIS — R39198 Other difficulties with micturition: Secondary | ICD-10-CM

## 2015-01-24 DIAGNOSIS — R3989 Other symptoms and signs involving the genitourinary system: Secondary | ICD-10-CM | POA: Diagnosis not present

## 2015-01-24 DIAGNOSIS — R300 Dysuria: Secondary | ICD-10-CM

## 2015-01-24 LAB — URINALYSIS, COMPLETE
BILIRUBIN UA: NEGATIVE
GLUCOSE, UA: NEGATIVE
KETONES UA: NEGATIVE
NITRITE UA: NEGATIVE
Protein, UA: NEGATIVE
SPEC GRAV UA: 1.02 (ref 1.005–1.030)
UUROB: 0.2 mg/dL (ref 0.2–1.0)
pH, UA: 6 (ref 5.0–7.5)

## 2015-01-24 LAB — MICROSCOPIC EXAMINATION: RBC, UA: NONE SEEN /hpf (ref 0–?)

## 2015-01-24 LAB — BLADDER SCAN AMB NON-IMAGING: SCAN RESULT: 0

## 2015-01-24 MED ORDER — UROGESIC-BLUE 81.6 MG PO TABS: 1.0000 | ORAL_TABLET | Freq: Four times a day (QID) | ORAL | Status: DC | PRN

## 2015-01-24 MED ORDER — TAMSULOSIN HCL 0.4 MG PO CAPS: 0.4000 mg | ORAL_CAPSULE | Freq: Every day | ORAL | Status: DC

## 2015-01-24 NOTE — Progress Notes (Signed)
01/24/2015 5:01 PM   Dawn Martinez 10-12-1990 [id number]  Referring provider: Maple Hudson., MD [address] [place], [place] [address]  Chief Complaint  Patient presents with  . Dysuria    HPI: Dawn Martinez is a 24 year old female presenting today with complaints of dysuria, urinary frequency, sensation of incomplete bladder emptying and bladder spasms. She states symptoms have been present for at least 2 years. She has never previously seen a urologist. She denies gross hematuria or history of kidney stones. Patient states that she does occasionally take over-the-counter Pyridium and reports significant improvement in her symptoms. He is not currently sexually active. Wet prep and GC/chlamydia test performed on 09/02/14 positive for white blood cells but otherwise negative.  Most recent urine culture negative on 12/18/14. Recent CBC and BMP within normal limits.   PMH: Past Medical History  Diagnosis Date  . ADHD (attention deficit hyperactivity disorder)   . Anxiety     Surgical History: Past Surgical History  Procedure Laterality Date  . Appendectomy      Home Medications:    Medication List       This list is accurate as of: 01/24/15  5:01 PM.  Always use your most recent med list.               clonazePAM 0.5 MG tablet  Commonly known as:  KLONOPIN  1/2 pill twice daily as needed for anxiety     fluticasone 50 MCG/ACT nasal spray  Commonly known as:  FLONASE  Place into the nose.     ibuprofen 800 MG tablet  Commonly known as:  ADVIL,MOTRIN  TK 1 T PO Q 8 H     methylphenidate 5 MG tablet  Commonly known as:  RITALIN  Take 1 tablet (5 mg total) by mouth 2 (two) times daily. Take 30 minutes before a meal     NUVARING 0.12-0.015 MG/24HR vaginal ring  Generic drug:  etonogestrel-ethinyl estradiol  INSERT THE RING INTO THE VAGINA MONTHLY     ondansetron 8 MG tablet  Commonly known as:  ZOFRAN  Take 1 tablet (8 mg total) by  mouth every 8 (eight) hours as needed for nausea or vomiting.     tamsulosin 0.4 MG Caps capsule  Commonly known as:  FLOMAX  Take 1 capsule (0.4 mg total) by mouth daily.     UROGESIC-BLUE 81.6 MG Tabs  Take 1 tablet (81.6 mg total) by mouth 4 (four) times daily as needed (as needed for dysuria and bladder spasms).        Allergies:  Allergies  Allergen Reactions  . Cefaclor     hives  . Promethazine Hcl     hallucinations    Family History: Family History  Problem Relation Age of Onset  . Depression Mother   . Anxiety disorder Mother   . Thyroid disease Mother   . Heart disease Father   . Diabetes Father   . Hypertension Father   . Congestive Heart Failure Father   . Prostate cancer Father   . Leukemia Father   . Gallbladder disease Sister   . ADD / ADHD Brother   . Endometriosis Sister     Social History:  reports that she quit smoking about 2 months ago. She has never used smokeless tobacco. She reports that she does not drink alcohol or use illicit drugs.  ROS: UROLOGY Frequent Urination?: Yes Hard to postpone urination?: Yes Burning/pain with urination?: No Get up at night to  urinate?: Yes Leakage of urine?: Yes Urine stream starts and stops?: No Trouble starting stream?: Yes Do you have to strain to urinate?: Yes Blood in urine?: Yes Urinary tract infection?: Yes Sexually transmitted disease?: No Injury to kidneys or bladder?: No Painful intercourse?: No Weak stream?: No Currently pregnant?: No Vaginal bleeding?: No Last menstrual period?: 12/2014  Gastrointestinal Nausea?: Yes Vomiting?: Yes Indigestion/heartburn?: No Diarrhea?: No Constipation?: No  Constitutional Fever: No Night sweats?: No Weight loss?: No Fatigue?: No  Skin Skin rash/lesions?: No Itching?: No  Eyes Blurred vision?: Yes Double vision?: No  Ears/Nose/Throat Sore throat?: No Sinus problems?: Yes  Hematologic/Lymphatic Swollen glands?: No Easy bruising?:  Yes  Cardiovascular Leg swelling?: No Chest pain?: No  Respiratory Cough?: No Shortness of breath?: No  Endocrine Excessive thirst?: No  Musculoskeletal Back pain?: No Joint pain?: No  Neurological Headaches?: Yes Dizziness?: No  Psychologic Depression?: No Anxiety?: Yes  Physical Exam: BP 114/78 mmHg  Pulse 74  Resp 99  Ht 5' (1.524 m)  Wt 151 lb 11.2 oz (68.811 kg)  BMI 29.63 kg/m2  LMP 01/05/2015 (Approximate)  Constitutional:  Alert and oriented, No acute distress. HEENT: [place] AT, moist mucus membranes.  Trachea midline, no masses. Cardiovascular: No clubbing, cyanosis, or edema. Respiratory: Normal respiratory effort, no increased work of breathing. GI: Abdomen is soft, nontender, nondistended, no abdominal masses GU: No CVA tenderness.  Skin: No rashes, bruises or suspicious lesions. Lymph: No cervical or inguinal adenopathy. Neurologic: Grossly intact, no focal deficits, moving all 4 extremities. Psychiatric: Normal mood and affect.  Laboratory Data: Lab Results  Component Value Date   WBC 6.3 12/20/2014   HGB 12.4 09/01/2014   HCT 38.2 12/20/2014   MCV 87 09/01/2014   PLT 293 09/01/2014    Lab Results  Component Value Date   CREATININE 0.79 12/20/2014    Urinalysis    Component Value Date/Time   COLORURINE Yellow 09/01/2014 2338   APPEARANCEUR Hazy 09/01/2014 2338   LABSPEC 1.026 09/01/2014 2338   PHURINE 5.0 09/01/2014 2338   GLUCOSEU Negative 01/24/2015 1012   GLUCOSEU Negative 09/01/2014 2338   HGBUR 1+ 09/01/2014 2338   BILIRUBINUR Negative 01/24/2015 1012   BILIRUBINUR negative 12/19/2014 0922   BILIRUBINUR Negative 09/01/2014 2338   KETONESUR Negative 09/01/2014 2338   PROTEINUR negative 12/19/2014 0922   PROTEINUR 30 mg/dL 69/62/9528 4132   UROBILINOGEN 0.2 12/19/2014 0922   NITRITE Negative 01/24/2015 1012   NITRITE negative 12/19/2014 0922   NITRITE Negative 09/01/2014 2338   LEUKOCYTESUR Trace* 01/24/2015 1012    LEUKOCYTESUR Negative 12/19/2014 0922   LEUKOCYTESUR Negative 09/01/2014 2338    Pertinent Imaging:  Assessment & Plan:   1. Dysuria-  Given patient's history and symptoms I suspect that she has interstitial cystitis. Urine sent for culture today. We discussed the diease pathophysiology is poorly understood therefore treatment has been focused primarily on symptomatic relief as well as dietary and behavioral modification. Information pamphlets were reviewed and given today discussing the current understanding of the syndrome as well as treatment options. IC dietary information also given today as many patients experience relief with simple lifestyle modifications. We also discussed intermittent use of over the counter pyridium (no greater than 3 days at at time) and I prescribed Urogesic Blue for intermittent relief. If conservative management fails, will consider further work up with cystoscopy to access for Hunter's ulcers or other more aggressive treatments, however, response to each of these interventions is highly variable.   - Urinalysis, Complete - BLADDER SCAN AMB  NON-IMAGING - URogesic blue  2. Difficulty urinating- Tamsulosin prescribed. We will see if this has any effect on patient's symptoms of difficulty voiding. - CULTURE, URINE COMPREHENSIVE  3. Urinary frequency- I suspect frequency related to patient's potential IC symptoms and will hopefully improve with behavioral modifications as discussed above. - CULTURE, URINE COMPREHENSIVE  Return in about 1 month (around 02/23/2015) for recheck IC.  Earlie Lou, FNP  [facility] [address], [address] [place], [place] [address] [phone]

## 2015-01-26 LAB — CULTURE, URINE COMPREHENSIVE

## 2015-01-27 ENCOUNTER — Ambulatory Visit (INDEPENDENT_AMBULATORY_CARE_PROVIDER_SITE_OTHER): Payer: 59

## 2015-01-27 DIAGNOSIS — Z23 Encounter for immunization: Secondary | ICD-10-CM | POA: Diagnosis not present

## 2015-01-29 ENCOUNTER — Telehealth: Payer: Self-pay

## 2015-01-29 NOTE — Telephone Encounter (Signed)
No answer. No mailbox.

## 2015-01-29 NOTE — Telephone Encounter (Signed)
-----   Message from Fernanda Drum, FNP sent at 01/26/2015  2:43 PM EDT ----- Please notify patient that her urine culture was negative.

## 2015-01-30 NOTE — Telephone Encounter (Signed)
Please specify medications.

## 2015-01-30 NOTE — Telephone Encounter (Signed)
Spoke with pt in reference to -Ucx. Pt asked if this medication would affect her with her only being 23 and is this going to be a long term medication. Please advise.

## 2015-01-30 NOTE — Telephone Encounter (Signed)
-----   Message from Lindsay C Overton, FNP sent at 01/26/2015  2:43 PM EDT ----- Please notify patient that her urine culture was negative. 

## 2015-01-31 NOTE — Telephone Encounter (Signed)
tamsulosin

## 2015-02-01 NOTE — Telephone Encounter (Signed)
Not available, no vm.

## 2015-02-01 NOTE — Telephone Encounter (Signed)
Please notify patient that it is her decision whether she would like to take the tamsulosin or not. I prescribed to her to help with her urine flow. It is considered off label use to prescribe it to women. There are no definitive studies performed on women using this medication but it is generally well tolerated. If she is trying to get pregnant I would recommend that she stop medication. Otherwise she can try and see if the medication improves her symptoms or she can not take it at all she is concerned about side effects. Thanks

## 2015-02-02 NOTE — Telephone Encounter (Signed)
Will send a letter

## 2015-02-14 ENCOUNTER — Encounter: Payer: Self-pay | Admitting: Family Medicine

## 2015-02-14 ENCOUNTER — Ambulatory Visit (INDEPENDENT_AMBULATORY_CARE_PROVIDER_SITE_OTHER): Payer: 59 | Admitting: Family Medicine

## 2015-02-14 VITALS — BP 110/64 | HR 78 | Temp 97.4°F | Resp 16 | Wt 151.2 lb

## 2015-02-14 DIAGNOSIS — J029 Acute pharyngitis, unspecified: Secondary | ICD-10-CM

## 2015-02-14 DIAGNOSIS — F909 Attention-deficit hyperactivity disorder, unspecified type: Secondary | ICD-10-CM

## 2015-02-14 DIAGNOSIS — F988 Other specified behavioral and emotional disorders with onset usually occurring in childhood and adolescence: Secondary | ICD-10-CM | POA: Insufficient documentation

## 2015-02-14 DIAGNOSIS — J069 Acute upper respiratory infection, unspecified: Secondary | ICD-10-CM

## 2015-02-14 LAB — POCT RAPID STREP A (OFFICE): Rapid Strep A Screen: NEGATIVE

## 2015-02-14 MED ORDER — METHYLPHENIDATE HCL 5 MG PO TABS
5.0000 mg | ORAL_TABLET | Freq: Two times a day (BID) | ORAL | Status: DC
Start: 2015-02-14 — End: 2015-02-14

## 2015-02-14 MED ORDER — METHYLPHENIDATE HCL 5 MG PO TABS: 5.0000 mg | ORAL_TABLET | Freq: Two times a day (BID) | ORAL | Status: DC

## 2015-02-14 NOTE — Progress Notes (Signed)
Subjective:     Patient ID: Dawn Martinez, female   DOB: [birth year], 24 y.o.   MRN: [id number]  HPI  Chief Complaint  Patient presents with  . Follow-up    Patient comes in for follow up from 01/16/15, patient was seen in office for ADHD and was advised to continue Ritalin  BID. Patient reports fair compliance, good symtom control on medication.   . Sore Throat    Patient has concerns of being exposed to strep, she states that her brother has strep and for the past 3 days patient has had sore throat.   Reports onset in the last 3 days of dry to sore throat, mild sinus congestion, frontal headache, and cough. States she wishes to stay at current dose of Ritalin. Occasionally will forget afternoon dose.   Review of Systems  Constitutional: Negative for fever and chills.       Objective:   Physical Exam  Constitutional: She appears well-developed and well-nourished. No distress.  Ears: T.M's intact without inflammation Throat: no tonsillar enlargement or exudate Neck: right anterior cervical tenderness  Lungs: clear    Assessment:    1. Pharyngitis - POCT rapid strep A  2. Upper respiratory infection  3. Attention deficit hyperactivity disorder (ADHD), unspecified ADHD type - methylphenidate (RITALIN) 5 MG tablet; Take 1 tablet (5 mg total) by mouth 2 (two) times daily. Take 30 minutes before a meal  Dispense: 60 tablet; Refill: 0    Plan:    Discussed use of Dayquil, Delsym and Benadryl at night for post nasal drainage.

## 2015-02-14 NOTE — Patient Instructions (Signed)
Discussed use of Dayquil, Delsym for cough, and Benadryl at night of post nasal drainage

## 2015-02-20 ENCOUNTER — Telehealth: Payer: Self-pay

## 2015-02-20 NOTE — Telephone Encounter (Signed)
Pt requested printed copy of flu immunization given in this office for prove for work.  Printed copy for pt.   Thanks,

## 2015-02-23 ENCOUNTER — Ambulatory Visit: Payer: 59 | Admitting: Obstetrics and Gynecology

## 2015-03-01 ENCOUNTER — Ambulatory Visit (INDEPENDENT_AMBULATORY_CARE_PROVIDER_SITE_OTHER): Payer: 59 | Admitting: Family Medicine

## 2015-03-01 ENCOUNTER — Encounter: Payer: Self-pay | Admitting: Family Medicine

## 2015-03-01 VITALS — BP 114/62 | HR 90 | Temp 98.0°F | Resp 16 | Wt 151.8 lb

## 2015-03-01 DIAGNOSIS — J069 Acute upper respiratory infection, unspecified: Secondary | ICD-10-CM | POA: Diagnosis not present

## 2015-03-01 MED ORDER — HYDROCODONE-HOMATROPINE 5-1.5 MG/5ML PO SYRP: ORAL_SOLUTION | ORAL | Status: DC

## 2015-03-01 NOTE — Patient Instructions (Signed)
Discussed use of Mucinex D. 

## 2015-03-01 NOTE — Progress Notes (Signed)
Subjective:     Patient ID: Dawn Martinez, female   DOB: 11-07-1990, 24 y.o.   MRN: [id number]  HPI  Chief Complaint  Patient presents with  . Cough    Patient comes in office today with concerns of cough and congestion since Saturday. Patient reports she has had symptoms of shortness of breath, sore throat, fever, sharp stabbing pain in her chest radiating to right sife of ribs, bloody nose and coughing up green/yellow phlegm. Patient reports taking Day/Night quil, Sudafed and Mucinex  States prior URI sx had resolved from o.v. Earlier this month. Reports onset of sore throat on 02/24/15. Reports she remains hoarse with cough but sinus congestion and sore throat have improved.   Review of Systems     Objective:   Physical Exam  Constitutional: She appears well-developed and well-nourished. No distress.  Ears: T.M's intact without inflammation Throat: no tonsillar enlargement or exudate Neck: no cervical adenopathy Lungs: clear     Assessment:    1. Upper respiratory infection - HYDROcodone-homatropine (HYCODAN) 5-1.5 MG/5ML syrup; 5 ml 4-6 hours as needed for cough  Dispense: 240 mL; Refill: 0    Plan:    May add Mucinex D. 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

## 2015-04-19 ENCOUNTER — Telehealth: Payer: Self-pay | Admitting: Family Medicine

## 2015-04-19 ENCOUNTER — Other Ambulatory Visit: Payer: Self-pay | Admitting: Family Medicine

## 2015-04-19 DIAGNOSIS — F411 Generalized anxiety disorder: Secondary | ICD-10-CM

## 2015-04-19 DIAGNOSIS — F909 Attention-deficit hyperactivity disorder, unspecified type: Secondary | ICD-10-CM

## 2015-04-19 MED ORDER — CLONAZEPAM 0.5 MG PO TABS: ORAL_TABLET | ORAL | Status: DC

## 2015-04-19 MED ORDER — METHYLPHENIDATE HCL 5 MG PO TABS: 5.0000 mg | ORAL_TABLET | Freq: Two times a day (BID) | ORAL | Status: DC

## 2015-04-19 NOTE — Telephone Encounter (Signed)
I have rewritten the prescriptions and they are ready for pickup. I do not write  3 month prescriptions for clonazepam.

## 2015-04-19 NOTE — Telephone Encounter (Signed)
Refill request. KW 

## 2015-04-19 NOTE — Telephone Encounter (Signed)
Pt contacted office for refill request on the following medications:  CB#[phone]/MW  clonazePAM (KLONOPIN) 0.5 MG tablet.  Pt is requesting a 90 day supply.  methylphenidate (RITALIN) 5 MG tablet.  Pt is requesting 3 different prescriptions.

## 2015-04-20 NOTE — Telephone Encounter (Signed)
Pt advised/MW °

## 2015-04-20 NOTE — Telephone Encounter (Signed)
Tried calling patient to let her know but her voicemail is not set up yet-aa

## 2015-04-23 ENCOUNTER — Other Ambulatory Visit: Payer: Self-pay | Admitting: Family Medicine

## 2015-04-23 ENCOUNTER — Telehealth: Payer: Self-pay | Admitting: Family Medicine

## 2015-04-23 DIAGNOSIS — R110 Nausea: Secondary | ICD-10-CM

## 2015-04-23 DIAGNOSIS — F909 Attention-deficit hyperactivity disorder, unspecified type: Secondary | ICD-10-CM

## 2015-04-23 MED ORDER — ONDANSETRON HCL 8 MG PO TABS: 8.0000 mg | ORAL_TABLET | Freq: Three times a day (TID) | ORAL | Status: DC | PRN

## 2015-04-23 MED ORDER — METHYLPHENIDATE HCL 5 MG PO TABS: 5.0000 mg | ORAL_TABLET | Freq: Two times a day (BID) | ORAL | Status: DC

## 2015-04-23 NOTE — Telephone Encounter (Signed)
Please review and advise. KW 

## 2015-04-23 NOTE — Telephone Encounter (Signed)
Zofran refilled. Ritalin rx reprinted.

## 2015-04-23 NOTE — Telephone Encounter (Signed)
Pt is going off her mother's insurance at the end of the year and would like to have ondansetron (ZOFRAN) 8 MG tablet refilled in case she needs it b/c sometimes her other medications can cause nausea and she would like to have it just in case. Pt also came into pick up her RX's for Ritalin. Pt stated she already had an RX for the month of December and left the RX dated for 04/19/15 and would like to pick up one dated to have filled in March 2017. I have placed the RX on a green chart and put it in Bob's box. Please advise. Thanks TNP

## 2015-04-24 NOTE — Telephone Encounter (Signed)
Attempted to contact patient again, phone continually rang and no voicemail was set up yet. Home number and mobile number are the same, will try reaching patient again tomorrow

## 2015-04-24 NOTE — Telephone Encounter (Signed)
Pt advised/MW °

## 2015-04-24 NOTE — Telephone Encounter (Signed)
Unable to reach patient at this time, voicemail has not been set up yet. Will try and contact patient later this afternoon. KW

## 2015-04-26 ENCOUNTER — Encounter: Payer: Self-pay | Admitting: *Deleted

## 2015-04-27 ENCOUNTER — Other Ambulatory Visit: Payer: Self-pay | Admitting: Obstetrics and Gynecology

## 2015-04-27 ENCOUNTER — Encounter: Payer: Self-pay | Admitting: Obstetrics and Gynecology

## 2015-04-27 ENCOUNTER — Other Ambulatory Visit (INDEPENDENT_AMBULATORY_CARE_PROVIDER_SITE_OTHER): Payer: 59

## 2015-04-27 ENCOUNTER — Ambulatory Visit (INDEPENDENT_AMBULATORY_CARE_PROVIDER_SITE_OTHER): Payer: 59 | Admitting: Obstetrics and Gynecology

## 2015-04-27 VITALS — BP 126/86 | HR 83 | Ht 60.0 in | Wt 152.6 lb

## 2015-04-27 DIAGNOSIS — R300 Dysuria: Secondary | ICD-10-CM

## 2015-04-27 DIAGNOSIS — Z8742 Personal history of other diseases of the female genital tract: Secondary | ICD-10-CM

## 2015-04-27 LAB — POCT URINALYSIS DIPSTICK
Glucose, UA: NEGATIVE
Ketones, UA: NEGATIVE
Leukocytes, UA: NEGATIVE
Nitrite, UA: NEGATIVE
Protein, UA: NEGATIVE
Spec Grav, UA: 1.02
UROBILINOGEN UA: 0.2
pH, UA: 5

## 2015-04-27 MED ORDER — PENTOSAN POLYSULFATE SODIUM 100 MG PO CAPS: 100.0000 mg | ORAL_CAPSULE | Freq: Three times a day (TID) | ORAL | Status: DC

## 2015-04-27 NOTE — Patient Instructions (Addendum)
Interstitial Cystitis Interstitial cystitis is a condition that causes inflammation of the bladder. The bladder is a hollow organ in the lower part of your abdomen. It stores urine after the urine is made by your kidneys. With interstitial cystitis, you may have pain in the bladder area. You may also have a frequent and urgent need to urinate. The severity of interstitial cystitis can vary from person to person. You may have flare-ups of the condition, and then it may go away for a while. For many people who have this condition, it becomes a long-term problem. CAUSES The cause of this condition is not known. RISK FACTORS This condition is more likely to develop in women. SYMPTOMS Symptoms of interstitial cystitis vary, and they can change over time. Symptoms may include:  Discomfort or pain in the bladder area. This can range from mild to severe. The pain may change in intensity as the bladder fills with urine or as it empties.  Pelvic pain.  An urgent need to urinate.  Frequent urination.  Pain during sexual intercourse.  Pinpoint bleeding on the bladder wall. For women, the symptoms often get worse during menstruation. DIAGNOSIS This condition is diagnosed by evaluating your symptoms and ruling out other causes. A physical exam will be done. Various tests may be done to rule out other conditions. Common tests include:  Urine tests.  Cystoscopy. In this test, a tool that is like a very thin telescope is used to look into your bladder.  Biopsy. This involves taking a sample of tissue from the bladder wall to be examined under a microscope. TREATMENT There is no cure for interstitial cystitis, but treatment methods are available to control your symptoms. Work closely with your health care provider to find the treatments that will be most effective for you. Treatment options may include:  Medicines to relieve pain and to help reduce the number of times that you feel the need to  urinate.  Bladder training. This involves learning ways to control when you urinate, such as:  Urinating at scheduled times.  Training yourself to delay urination.  Doing exercises (Kegel exercises) to strengthen the muscles that control urine flow.  Lifestyle changes, such as changing your diet or taking steps to control stress.  Use of a device that provides electrical stimulation in order to reduce pain.  A procedure that stretches your bladder by filling it with air or fluid.  Surgery. This is rare. It is only done for extreme cases if other treatments do not help. HOME CARE INSTRUCTIONS  Take medicines only as directed by your health care provider.  Use bladder training techniques as directed.  Keep a bladder diary to find out which foods, liquids, or activities make your symptoms worse.  Use your bladder diary to schedule bathroom trips. If you are away from home, plan to be near a bathroom at each of your scheduled times.  Make sure you urinate just before you leave the house and just before you go to bed.  Do Kegel exercises as directed by your health care provider.  Do not drink alcohol.  Do not use any tobacco products, including cigarettes, chewing tobacco, or electronic cigarettes. If you need help quitting, ask your health care provider.  Make dietary changes as directed by your health care provider. You may need to avoid spicy foods and foods that contain a high amount of potassium.  Limit your drinking of beverages that stimulate urination. These include soda, coffee, and tea.  Keep all follow-up   visits as directed by your health care provider. This is important. SEEK MEDICAL CARE IF:  Your symptoms do not get better after treatment.  Your pain and discomfort are getting worse.  You have more frequent urges to urinate.  You have a fever. SEEK IMMEDIATE MEDICAL CARE IF:  You are not able to control your bladder at all.   This information is not  intended to replace advice given to you by your health care provider. Make sure you discuss any questions you have with your health care provider.   Document Released: 12/28/2003 Document Revised: 05/19/2014 Document Reviewed: 01/03/2014 Elsevier Interactive Patient Education 2016 Elsevier Inc.   Pentosan capsules What is this medicine? PENTOSAN (PEN toe san) is used to treat the bladder pain or discomfort caused by interstitial cystitis. This is a condition that causes inflammation of parts of the kidney. This medicine may be used for other purposes; ask your health care provider or pharmacist if you have questions. What should I tell my health care provider before I take this medicine? They need to know if you have any of these conditions: -kidney or liver disease -an unusual or allergic reaction to pentosan, other medicines, foods, dyes, or preservatives -pregnant or trying to get pregnant -breast-feeding How should I use this medicine? Take this medicine by mouth with a glass of water. Follow the directions on the prescription label. Take this medicine on an empty stomach, at least 1 hour before or 2 hours after food. Do not take with food. Take your doses at regular intervals. Do not take your medicine more often than directed. Talk to your pediatrician regarding the use of this medicine in children. Special care may be needed. Overdosage: If you think you have taken too much of this medicine contact a poison control center or emergency room at once. NOTE: This medicine is only for you. Do not share this medicine with others. What if I miss a dose? If you miss a dose, take it as soon as you can. If it is almost time for your next dose, take only that dose. Do not take double or extra doses. What may interact with this medicine? Do not take this medicine with any of the following medications: -agents that prevent or dissolve blood clots like heparin or warfarin -aspirin, especially in  higher doses -mifepristone This medicine may also interact with the following medications: -clopidogrel -dipyridamole -NSAIDs, medicines for pain and inflammation, like ibuprofen or naproxen -ticlopidine This list may not describe all possible interactions. Give your health care provider a list of all the medicines, herbs, non-prescription drugs, or dietary supplements you use. Also tell them if you smoke, drink alcohol, or use illegal drugs. Some items may interact with your medicine. What should I watch for while using this medicine? Tell your doctor or health care professional that you are taking this medicine if you are going to have a medical procedure or surgery. What side effects may I notice from receiving this medicine? Side effects that you should report to your doctor or health care professional as soon as possible: -allergic reactions like skin rash, itching or hives, swelling of the face, lips, or tongue -diarrhea -rash -signs and symptoms of bleeding such as bloody or black, tarry stools; red or dark-brown urine; spitting up blood or brown material that looks like coffee grounds; red spots on the skin; unusual bruising or bleeding from the eye, gums, or nose -stomach pain Side effects that usually do not require medical attention (report   to your doctor or health care professional if they continue or are bothersome): -hair loss -headache -nausea This list may not describe all possible side effects. Call your doctor for medical advice about side effects. You may report side effects to FDA at 1-800-FDA-1088. Where should I keep my medicine? Keep out of the reach of children. Store at room temperature between 15 and 30 degrees C (59 and 86 degrees F). Throw away any unused medicine after the expiration date. NOTE: This sheet is a summary. It may not cover all possible information. If you have questions about this medicine, talk to your doctor, pharmacist, or health care provider.     2016, Elsevier/Gold Standard. (2012-08-13 15:36:02)  

## 2015-04-27 NOTE — Progress Notes (Signed)
Subjective:     Patient ID: Dawn Martinez, female   DOB: [birth year], 24 y.o.   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  Pain at introtuois with penetration, over the last few months, same partner.Feels like it is drier.    Objective:   Physical Exam   A&O x4  well groomed female Abdomen soft and nontender Pelvic exam: normal external genitalia, vulva, vagina, cervix, uterus and adnexa, reports pain at introitus with introduction of speculum, pap obtained. U/s reveals: Indications:  Pelvic pain x 6-7  Months and follow up of ovarian cyst seen on outside imaging of 08/2014. Findings:  The uterus measures 7.0 x 3.6 x 4.4 cm Echo texture is homogenous and without evidence of focal masses.  The Endometrium shows a tri-layered periovulatory phase measuring  9.3 mm.  Right Ovary measures 3.1 x 1.8 x 2.0 cm. Dominant follicles seen, and appear normal in appearance. Left Ovary measures 2.7 x 2.1 x 1.9 cm. Dominant follicles are seen, and appear normal in appearance. Survey of the adnexa demonstrates no adnexal masses. There is no free fluid in the cul de sac.  Impression: 1. Normal appearing retroverted uterus with a periovulatory endometrium and multiple follicles on each ovary. 2. The ovarian cysts seen on prior ultrasound appear to have resolved.     Assessment:     IC Pelvic pain F/u ovarian cyst from April 2016 dysparenia H/o abnormal pap  Smoker- motivated to quit    Plan:     Stop Nuvaring x 3 months to see if symptoms improve. To add Elmeron tdi prn for IC, f/u in 3 months Add prn use of vaginal lubricant Smoking cessation encouraged  Melody PurdyShambley, CNM

## 2015-04-30 LAB — CYTOLOGY - PAP

## 2015-05-01 ENCOUNTER — Other Ambulatory Visit: Payer: Self-pay | Admitting: *Deleted

## 2015-05-01 ENCOUNTER — Encounter: Payer: Self-pay | Admitting: Obstetrics and Gynecology

## 2015-05-01 MED ORDER — PENTOSAN POLYSULFATE SODIUM 100 MG PO CAPS: 100.0000 mg | ORAL_CAPSULE | Freq: Three times a day (TID) | ORAL | Status: DC

## 2015-05-02 ENCOUNTER — Other Ambulatory Visit: Payer: Self-pay | Admitting: *Deleted

## 2015-05-02 ENCOUNTER — Telehealth: Payer: Self-pay | Admitting: *Deleted

## 2015-05-02 MED ORDER — NORETHIN ACE-ETH ESTRAD-FE 1-20 MG-MCG(24) PO TABS: 1.0000 | ORAL_TABLET | Freq: Every day | ORAL | Status: DC

## 2015-05-02 NOTE — Telephone Encounter (Signed)
Done-ac 

## 2015-05-02 NOTE — Telephone Encounter (Signed)
Please send a script for whatever she chooses, and she is to start in the fourth day of next menses.

## 2015-05-02 NOTE — Telephone Encounter (Signed)
Pt called she would really like to be on birth control, she states she just doesn't really like using condoms pls advise

## 2015-06-14 ENCOUNTER — Encounter: Payer: Self-pay | Admitting: Family Medicine

## 2015-06-14 NOTE — Progress Notes (Signed)
reviewed

## 2015-06-14 NOTE — Progress Notes (Unsigned)
On 04/23/15 patient was prescribed Ritalin  with 0 refills, script was left at front desk for pick up. Patient was notified of prescription but never picked up script.

## 2015-07-27 ENCOUNTER — Ambulatory Visit: Payer: 59 | Admitting: Obstetrics and Gynecology

## 2015-08-17 ENCOUNTER — Telehealth: Payer: Self-pay | Admitting: Family Medicine

## 2015-08-17 ENCOUNTER — Other Ambulatory Visit: Payer: Self-pay | Admitting: Family Medicine

## 2015-08-17 DIAGNOSIS — F909 Attention-deficit hyperactivity disorder, unspecified type: Secondary | ICD-10-CM

## 2015-08-17 DIAGNOSIS — J301 Allergic rhinitis due to pollen: Secondary | ICD-10-CM

## 2015-08-17 DIAGNOSIS — F411 Generalized anxiety disorder: Secondary | ICD-10-CM

## 2015-08-17 MED ORDER — CLONAZEPAM 0.5 MG PO TABS: ORAL_TABLET | ORAL | Status: DC

## 2015-08-17 MED ORDER — METHYLPHENIDATE HCL 5 MG PO TABS: 5.0000 mg | ORAL_TABLET | Freq: Two times a day (BID) | ORAL | Status: DC

## 2015-08-17 MED ORDER — FLUTICASONE PROPIONATE 50 MCG/ACT NA SUSP: 2.0000 | Freq: Every day | NASAL | Status: DC

## 2015-08-17 MED ORDER — METHYLPHENIDATE HCL 5 MG PO TABS
5.0000 mg | ORAL_TABLET | Freq: Two times a day (BID) | ORAL | Status: DC
Start: 2015-08-17 — End: 2015-08-17

## 2015-08-17 NOTE — Telephone Encounter (Signed)
Prescriptions are up front for pickup. Will need an office visit at next refill.

## 2015-08-17 NOTE — Telephone Encounter (Signed)
Pt needs refills clonazePAM (KLONOPIN) 0.5 MG tablet  methylphenidate (RITALIN) 5 MG tablet  She also needs a refill on flonase  She does not have insurance right now so be sure to use generic.  Would like to have them this afternoon  Thanks, Fortune Brandsteri

## 2016-01-29 ENCOUNTER — Other Ambulatory Visit: Payer: Self-pay | Admitting: Family Medicine

## 2016-01-29 DIAGNOSIS — F411 Generalized anxiety disorder: Secondary | ICD-10-CM

## 2016-01-29 DIAGNOSIS — F909 Attention-deficit hyperactivity disorder, unspecified type: Secondary | ICD-10-CM

## 2016-01-29 MED ORDER — METHYLPHENIDATE HCL 5 MG PO TABS: 5.0000 mg | ORAL_TABLET | Freq: Two times a day (BID) | ORAL | 0 refills | Status: DC

## 2016-01-29 MED ORDER — CLONAZEPAM 0.5 MG PO TABS: ORAL_TABLET | ORAL | 0 refills | Status: DC

## 2016-01-29 NOTE — Telephone Encounter (Signed)
LOV 03/01/2015. Does not have another OV scheduled. Okay to fill Ritalin x 3 months? Allene DillonEmily Drozdowski, CMA

## 2016-01-29 NOTE — Telephone Encounter (Signed)
Pt contacted office for refill request on the following medications:  CB#[phone]/MW  clonazePAM (KLONOPIN) 0.5 MG tablet  methylphenidate (RITALIN) 5 MG tablet-Pt is requesting 3 Rx for this

## 2016-02-04 ENCOUNTER — Telehealth: Payer: Self-pay | Admitting: Family Medicine

## 2016-02-04 ENCOUNTER — Other Ambulatory Visit: Payer: Self-pay | Admitting: Family Medicine

## 2016-02-04 DIAGNOSIS — F909 Attention-deficit hyperactivity disorder, unspecified type: Secondary | ICD-10-CM

## 2016-02-04 MED ORDER — METHYLPHENIDATE HCL 5 MG PO TABS: 5.0000 mg | ORAL_TABLET | Freq: Two times a day (BID) | ORAL | 0 refills | Status: DC

## 2016-02-04 NOTE — Progress Notes (Signed)
Completed form on your desk

## 2016-02-04 NOTE — Progress Notes (Signed)
Form has been faxed. KW 

## 2016-02-04 NOTE — Telephone Encounter (Signed)
Pt states she has spoke to the mail order and they have not rec'd a Rx for clonazePAM (KLONOPIN) 0.5 MG tablet or methylphenidate (RITALIN) 5 MG tablet.  Pt states she has change insurance and the new pharmacy mail order is [facility]. Fax #[phone]. Pt request these resent to mail order.  CB#[phone]/MW

## 2016-02-04 NOTE — Telephone Encounter (Signed)
If they do not accept a phoned in rx have then send us their rx fax form.

## 2016-02-04 NOTE — Telephone Encounter (Signed)
Error/MW °

## 2016-02-04 NOTE — Telephone Encounter (Signed)
Called [facility] mail order pharmacy they did not have in there system prescription's from 01/29/2016. I was able to verbally call in Clonazepam but they would not let me authorize Ritalin they stated that you would have to resend it (e-scribe). KW

## 2016-02-04 NOTE — Progress Notes (Signed)
Waiting on [facility] prescription form to be faxed for provider to fill out for Ritalin.KW

## 2016-02-04 NOTE — Progress Notes (Signed)
Form on your desk. KW

## 2016-02-05 ENCOUNTER — Other Ambulatory Visit: Payer: Self-pay | Admitting: Family Medicine

## 2016-02-05 DIAGNOSIS — F909 Attention-deficit hyperactivity disorder, unspecified type: Secondary | ICD-10-CM

## 2016-02-06 ENCOUNTER — Other Ambulatory Visit: Payer: Self-pay | Admitting: Family Medicine

## 2016-02-06 ENCOUNTER — Telehealth: Payer: Self-pay

## 2016-02-06 DIAGNOSIS — F909 Attention-deficit hyperactivity disorder, unspecified type: Secondary | ICD-10-CM

## 2016-02-06 MED ORDER — METHYLPHENIDATE HCL 5 MG PO TABS: 5.0000 mg | ORAL_TABLET | Freq: Two times a day (BID) | ORAL | 0 refills | Status: DC

## 2016-02-06 NOTE — Telephone Encounter (Signed)
Patient was advised that Rx for Ritalin is available at front for pick up. KW

## 2016-02-06 NOTE — Telephone Encounter (Signed)
Advised pt that Clonazepam was called in, and we are waiting on an RX form from [facility].  She says she is now out of Ritalin and if it could be filled out as soon as possible.   Thanks,   -Vernona RiegerLaura

## 2016-02-25 ENCOUNTER — Encounter: Payer: Self-pay | Admitting: Family Medicine

## 2016-02-25 ENCOUNTER — Ambulatory Visit (INDEPENDENT_AMBULATORY_CARE_PROVIDER_SITE_OTHER): Payer: 59 | Admitting: Family Medicine

## 2016-02-25 ENCOUNTER — Ambulatory Visit: Payer: 59 | Admitting: Family Medicine

## 2016-02-25 VITALS — BP 100/64 | HR 74 | Temp 98.3°F | Resp 16 | Wt 148.8 lb

## 2016-02-25 DIAGNOSIS — Z8659 Personal history of other mental and behavioral disorders: Secondary | ICD-10-CM

## 2016-02-25 DIAGNOSIS — F909 Attention-deficit hyperactivity disorder, unspecified type: Secondary | ICD-10-CM | POA: Diagnosis not present

## 2016-02-25 DIAGNOSIS — J301 Allergic rhinitis due to pollen: Secondary | ICD-10-CM

## 2016-02-25 MED ORDER — METHYLPHENIDATE HCL 5 MG PO TABS: 5.0000 mg | ORAL_TABLET | Freq: Two times a day (BID) | ORAL | 0 refills | Status: DC

## 2016-02-25 MED ORDER — FLUTICASONE PROPIONATE 50 MCG/ACT NA SUSP: 2.0000 | Freq: Every day | NASAL | 5 refills | Status: DC

## 2016-02-25 NOTE — Patient Instructions (Signed)
Let me know when you are running out of clonazepam and what your use pattern is at that time.

## 2016-02-25 NOTE — Progress Notes (Signed)
Subjective:     Patient ID: Dawn Martinez, female   DOB: [birth year], 25 y.o.   MRN: [id number]  HPI  Chief Complaint  Patient presents with  . 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 Currently using clonazepam 1/4 to 1/2 up to twice daily to help with nerves and anxiety.   Review of Systems     Objective:   Physical Exam  Constitutional: She appears well-developed and well-nourished. No distress.  Psychiatric: She has a normal mood and affect. Her behavior is normal.       Assessment:    1. History of anxiety: continue clonazepam prn  2. Attention deficit hyperactivity disorder (ADHD), unspecified ADHD type: Rx x 3 month  provided. - methylphenidate (RITALIN) 5 MG tablet; Take 1 tablet (5 mg total) by mouth 2 (two) times daily. Take 30 minutes before a meal  Dispense: 60 tablet; Refill: 0  3. Chronic seasonal allergic rhinitis due to pollen - fluticasone (FLONASE) 50 MCG/ACT nasal spray; Place 2 sprays into both nostrils daily.  Dispense: 16 g; Refill: 5    Plan:    Will call for refills as needed on clonazepam.

## 2016-03-18 IMAGING — US ABDOMEN ULTRASOUND LIMITED
1 series · 14 of 25 positions shown · non-contrast
Comparison: None.

CLINICAL DATA: Right upper quadrant and epigastric abdominal pain,
radiating to the right shoulder for 2 days. Initial encounter.
Leukocytosis.

EXAM:
US ABDOMEN LIMITED - RIGHT UPPER QUADRANT

[Series 1: abdomen ultrasound limited · 0.21mm/px · 14 of 86 slices shown]
[im 1/86]
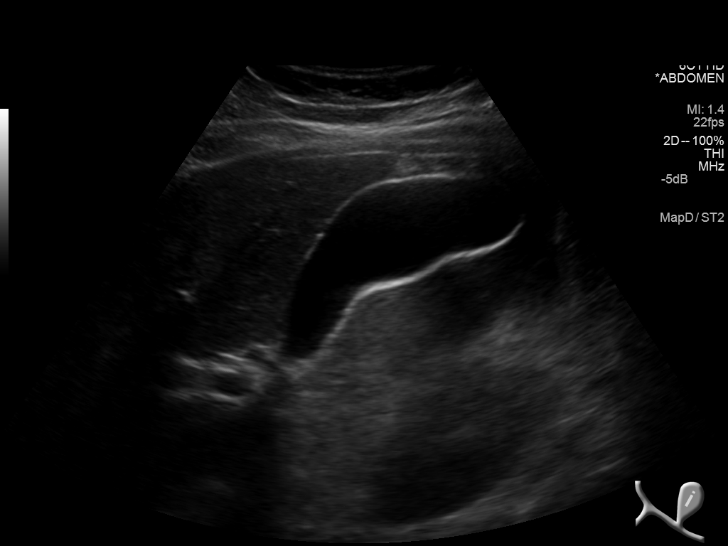
[im 8/86]
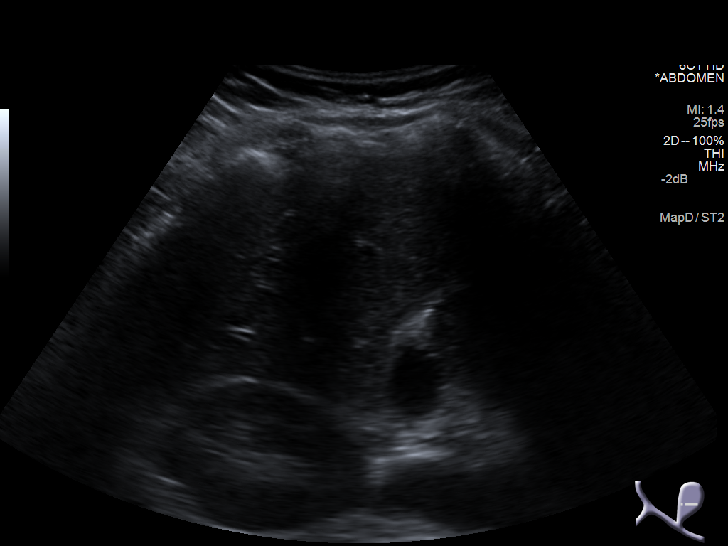
[im 15/86]
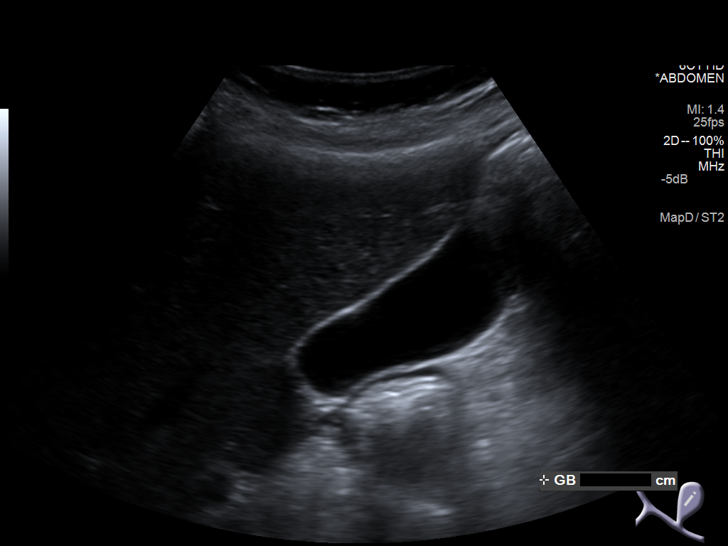
[im 22/86]
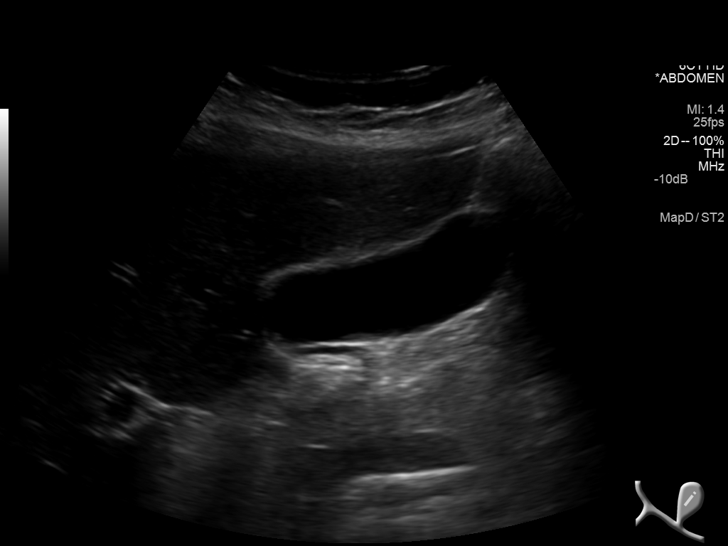
[im 29/86]
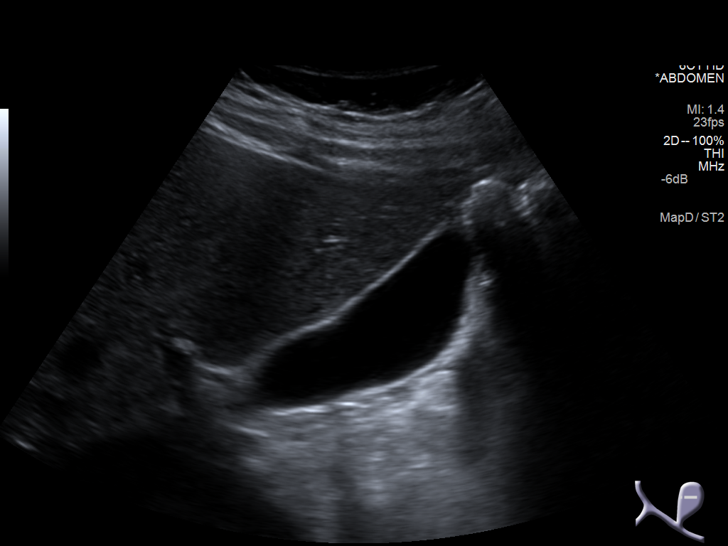
[im 32/86]
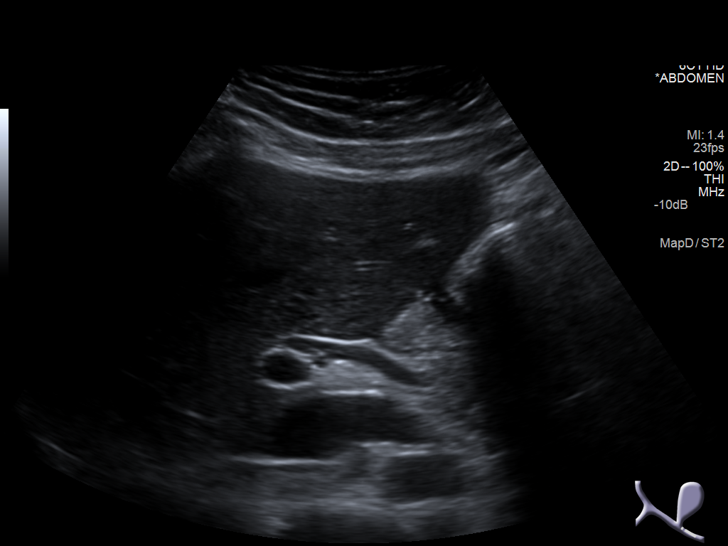
[im 39/86]
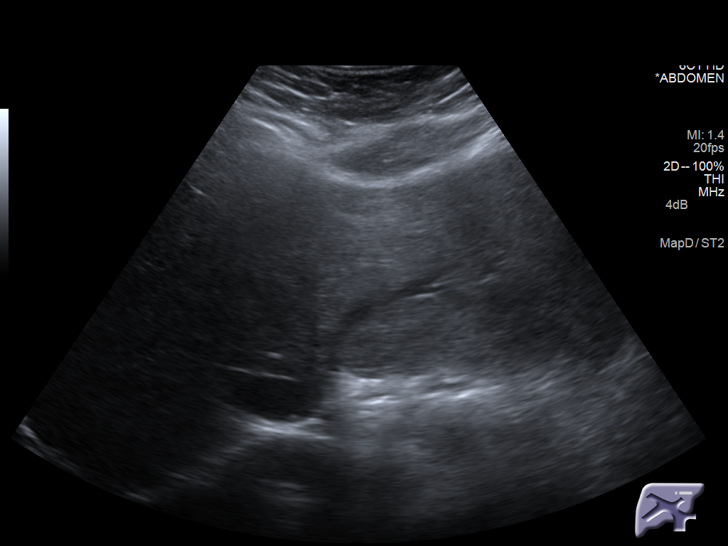
[im 47/86]
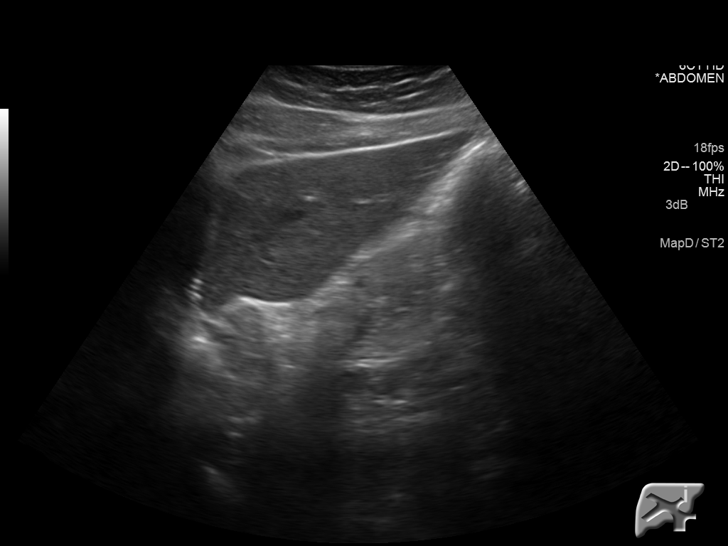
[im 54/86]
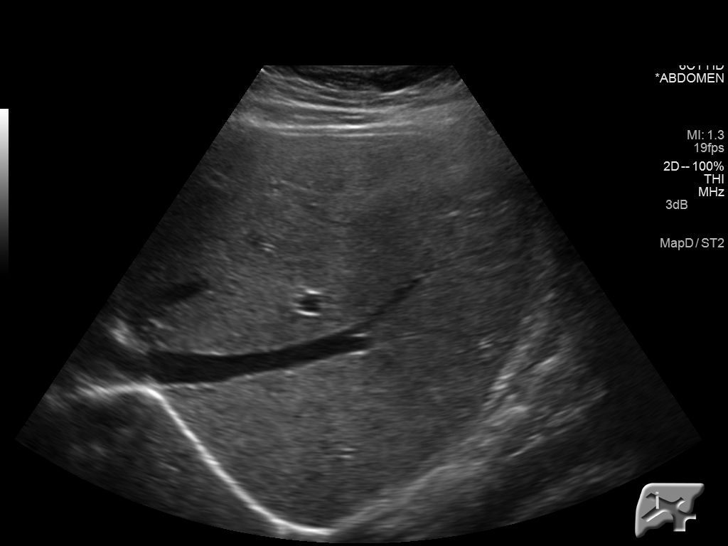
[im 57/86]
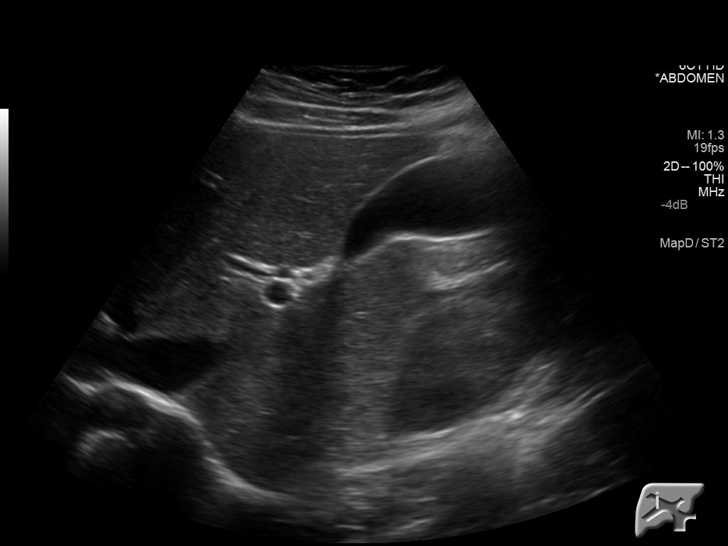
[im 64/86]
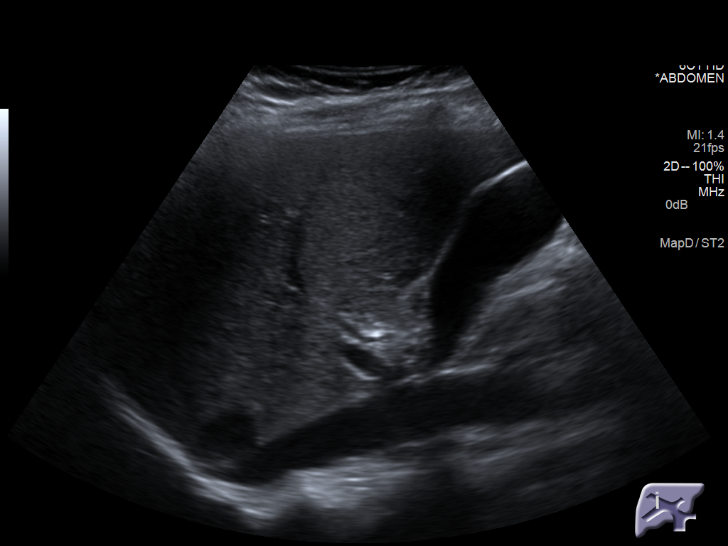
[im 71/86]
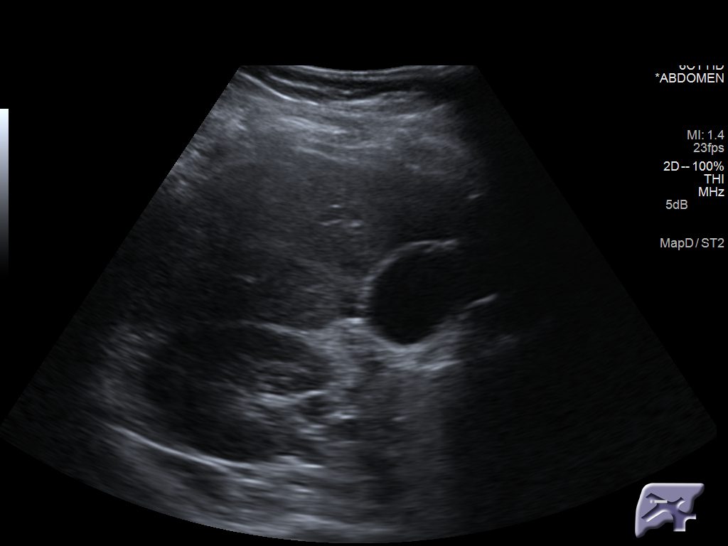
[im 78/86]
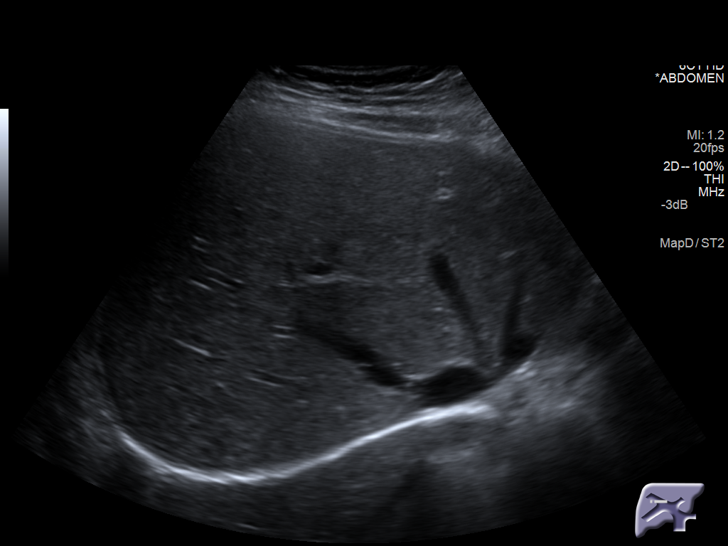
[im 86/86]
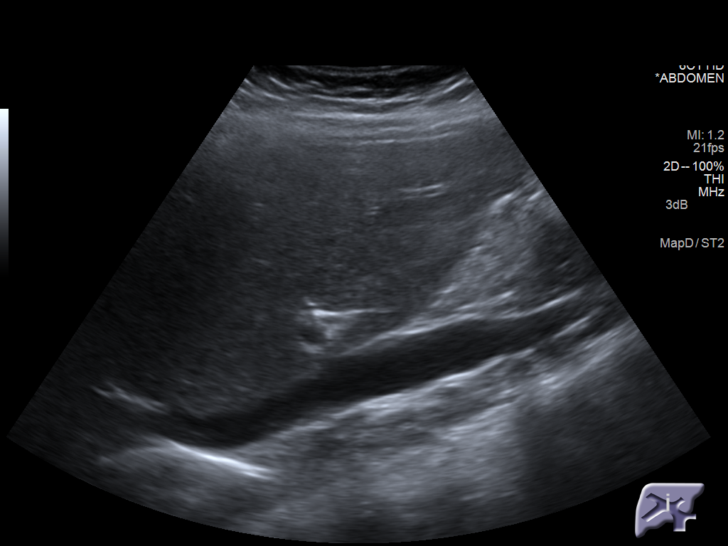

[14 of 25 positions shown; findings below may reference images not displayed]

FINDINGS: Gallbladder:

No gallstones or wall thickening visualized. No sonographic Murphy
sign noted.

Common bile duct:

Diameter: 0.5 cm, within normal limits in caliber.

Liver:

No focal lesion identified. Within normal limits in parenchymal
echogenicity.

A small amount of free fluid is seen tracking between the liver and
right kidney.
IMPRESSION: 1. Gallbladder unremarkable in appearance. Common bile duct normal
in caliber.
2. Small amount of ascites noted tracking between the liver and
right kidney.

## 2016-04-24 ENCOUNTER — Other Ambulatory Visit: Payer: Self-pay | Admitting: Family Medicine

## 2016-04-24 ENCOUNTER — Telehealth: Payer: Self-pay | Admitting: Family Medicine

## 2016-04-24 DIAGNOSIS — R110 Nausea: Secondary | ICD-10-CM

## 2016-04-24 DIAGNOSIS — F411 Generalized anxiety disorder: Secondary | ICD-10-CM

## 2016-04-24 MED ORDER — ONDANSETRON HCL 8 MG PO TABS: 8.0000 mg | ORAL_TABLET | Freq: Three times a day (TID) | ORAL | 0 refills | Status: DC | PRN

## 2016-04-24 MED ORDER — CLONAZEPAM 0.5 MG PO TABS: ORAL_TABLET | ORAL | 5 refills | Status: DC

## 2016-04-24 NOTE — Progress Notes (Signed)
RX called in at [facility] outpatient pharmacy.  

## 2016-04-24 NOTE — Telephone Encounter (Signed)
Pt contacted office for refill request on the following medications: 1. clonazePAM (KLONOPIN) 0.5 MG tablet  Last Rx: 01/29/16 2. ondansetron (ZOFRAN) 8 MG tablet Last Rx: 04/23/15 Pt would [facility]. Please advise. Thanks TNP

## 2016-05-01 ENCOUNTER — Encounter: Payer: Self-pay | Admitting: Family Medicine

## 2016-05-01 ENCOUNTER — Ambulatory Visit (INDEPENDENT_AMBULATORY_CARE_PROVIDER_SITE_OTHER): Payer: 59 | Admitting: Family Medicine

## 2016-05-01 VITALS — BP 104/70 | HR 66 | Temp 97.9°F | Resp 16 | Wt 149.4 lb

## 2016-05-01 DIAGNOSIS — J069 Acute upper respiratory infection, unspecified: Secondary | ICD-10-CM | POA: Diagnosis not present

## 2016-05-01 DIAGNOSIS — B9789 Other viral agents as the cause of diseases classified elsewhere: Secondary | ICD-10-CM

## 2016-05-01 MED ORDER — AMOXICILLIN-POT CLAVULANATE 875-125 MG PO TABS: 1.0000 | ORAL_TABLET | Freq: Two times a day (BID) | ORAL | 0 refills | Status: DC

## 2016-05-01 MED ORDER — HYDROCODONE-HOMATROPINE 5-1.5 MG/5ML PO SYRP: ORAL_SOLUTION | ORAL | 0 refills | Status: DC

## 2016-05-01 NOTE — Progress Notes (Signed)
Subjective:     Patient ID: Dawn Martinez, female   DOB: December 31, 1990, 25 y.o.   MRN: [id number]  HPI  Chief Complaint  Patient presents with  . Cough    Patient comes in office today with complaints of cough and chest congestion for the past 5 days. Patient reports cough is productive of bloody mucous, patient reports shortness of breath and sinus pain/pressure. Patient has taken otc Mucinex and Cold/Flu medication.   States her sore throat has resolved.   Review of Systems     Objective:   Physical Exam  Constitutional: She appears well-developed and well-nourished.  Ears: T.M's intact without inflammation Sinuses: non-tender Throat: no tonsillar enlargement or exudate Neck: no cervical adenopathy Lungs: clear     Assessment:    1. Viral upper respiratory tract infection - HYDROcodone-homatropine (HYCODAN) 5-1.5 MG/5ML syrup; 5 ml 4-6 hours as needed for cough  Dispense: 240 mL; Refill: 0 - amoxicillin-clavulanate (AUGMENTIN) 875-125 MG tablet; Take 1 tablet by mouth 2 (two) times daily.  Dispense: 20 tablet; Refill: 0    Plan:    Discussed use of Mucinex D and Delsym. Start abx if sinuses not clearing up over the next 3 days.

## 2016-05-01 NOTE — Patient Instructions (Addendum)
Discussed use of Mucinex D for congestion. May also try Delsym for cough. Start the antibiotic if sinuses are not clearing over the weekend.

## 2016-05-08 ENCOUNTER — Other Ambulatory Visit: Payer: Self-pay | Admitting: Family Medicine

## 2016-05-08 ENCOUNTER — Telehealth: Payer: Self-pay

## 2016-05-08 MED ORDER — FLUCONAZOLE 150 MG PO TABS: 150.0000 mg | ORAL_TABLET | Freq: Once | ORAL | 1 refills | Status: AC

## 2016-05-08 NOTE — Telephone Encounter (Signed)
Diflucan sent in

## 2016-05-08 NOTE — Telephone Encounter (Signed)
Pt advised-aa 

## 2016-05-08 NOTE — Telephone Encounter (Signed)
Patient called and states she started on Augmentin 3 days ago and has started having some vaginal discomfort and some itching and feels like a yeast infection. She states in the past antibiotics caused her that. She wanted to know if we could send in a Diflucan for her to help please. [facility], [place] [phone]

## 2016-06-11 DIAGNOSIS — Z72820 Sleep deprivation: Secondary | ICD-10-CM | POA: Diagnosis not present

## 2016-06-11 DIAGNOSIS — M791 Myalgia: Secondary | ICD-10-CM | POA: Diagnosis not present

## 2016-06-11 DIAGNOSIS — M6240 Contracture of muscle, unspecified site: Secondary | ICD-10-CM | POA: Diagnosis not present

## 2016-06-11 DIAGNOSIS — M9902 Segmental and somatic dysfunction of thoracic region: Secondary | ICD-10-CM | POA: Diagnosis not present

## 2016-06-11 DIAGNOSIS — M9901 Segmental and somatic dysfunction of cervical region: Secondary | ICD-10-CM | POA: Diagnosis not present

## 2016-06-11 DIAGNOSIS — R51 Headache: Secondary | ICD-10-CM | POA: Diagnosis not present

## 2016-06-11 DIAGNOSIS — M75100 Unspecified rotator cuff tear or rupture of unspecified shoulder, not specified as traumatic: Secondary | ICD-10-CM | POA: Diagnosis not present

## 2016-06-13 DIAGNOSIS — Z72820 Sleep deprivation: Secondary | ICD-10-CM | POA: Diagnosis not present

## 2016-06-13 DIAGNOSIS — M6240 Contracture of muscle, unspecified site: Secondary | ICD-10-CM | POA: Diagnosis not present

## 2016-06-13 DIAGNOSIS — R51 Headache: Secondary | ICD-10-CM | POA: Diagnosis not present

## 2016-06-13 DIAGNOSIS — M9902 Segmental and somatic dysfunction of thoracic region: Secondary | ICD-10-CM | POA: Diagnosis not present

## 2016-06-13 DIAGNOSIS — M9901 Segmental and somatic dysfunction of cervical region: Secondary | ICD-10-CM | POA: Diagnosis not present

## 2016-06-13 DIAGNOSIS — M75100 Unspecified rotator cuff tear or rupture of unspecified shoulder, not specified as traumatic: Secondary | ICD-10-CM | POA: Diagnosis not present

## 2016-06-13 DIAGNOSIS — M791 Myalgia: Secondary | ICD-10-CM | POA: Diagnosis not present

## 2016-06-18 DIAGNOSIS — Z72820 Sleep deprivation: Secondary | ICD-10-CM | POA: Diagnosis not present

## 2016-06-18 DIAGNOSIS — M75100 Unspecified rotator cuff tear or rupture of unspecified shoulder, not specified as traumatic: Secondary | ICD-10-CM | POA: Diagnosis not present

## 2016-06-18 DIAGNOSIS — M6240 Contracture of muscle, unspecified site: Secondary | ICD-10-CM | POA: Diagnosis not present

## 2016-06-18 DIAGNOSIS — M9902 Segmental and somatic dysfunction of thoracic region: Secondary | ICD-10-CM | POA: Diagnosis not present

## 2016-06-18 DIAGNOSIS — R51 Headache: Secondary | ICD-10-CM | POA: Diagnosis not present

## 2016-06-18 DIAGNOSIS — M9901 Segmental and somatic dysfunction of cervical region: Secondary | ICD-10-CM | POA: Diagnosis not present

## 2016-06-18 DIAGNOSIS — M791 Myalgia: Secondary | ICD-10-CM | POA: Diagnosis not present

## 2016-07-02 ENCOUNTER — Emergency Department (HOSPITAL_COMMUNITY): Payer: 59

## 2016-07-02 ENCOUNTER — Emergency Department (HOSPITAL_COMMUNITY)
Admission: EM | Admit: 2016-07-02 | Discharge: 2016-07-02 | Disposition: A | Discharge status: Home / Self Care | Payer: 59 | Attending: Emergency Medicine | Admitting: Emergency Medicine

## 2016-07-02 ENCOUNTER — Encounter (HOSPITAL_COMMUNITY): Payer: Self-pay | Admitting: Cardiology

## 2016-07-02 DIAGNOSIS — R1033 Periumbilical pain: Secondary | ICD-10-CM | POA: Diagnosis not present

## 2016-07-02 DIAGNOSIS — F909 Attention-deficit hyperactivity disorder, unspecified type: Secondary | ICD-10-CM | POA: Insufficient documentation

## 2016-07-02 DIAGNOSIS — K5900 Constipation, unspecified: Secondary | ICD-10-CM | POA: Diagnosis not present

## 2016-07-02 DIAGNOSIS — Z87891 Personal history of nicotine dependence: Secondary | ICD-10-CM | POA: Diagnosis not present

## 2016-07-02 DIAGNOSIS — R109 Unspecified abdominal pain: Secondary | ICD-10-CM | POA: Diagnosis present

## 2016-07-02 LAB — CBC WITH DIFFERENTIAL/PLATELET
Basophils Absolute: 0 10*3/uL (ref 0.0–0.1)
Basophils Relative: 0 %
EOS ABS: 0.1 10*3/uL (ref 0.0–0.7)
Eosinophils Relative: 0 %
HEMATOCRIT: 41.2 % (ref 36.0–46.0)
HEMOGLOBIN: 13.9 g/dL (ref 12.0–15.0)
LYMPHS ABS: 3.1 10*3/uL (ref 0.7–4.0)
LYMPHS PCT: 18 %
MCH: 28.8 pg (ref 26.0–34.0)
MCHC: 33.7 g/dL (ref 30.0–36.0)
MCV: 85.5 fL (ref 78.0–100.0)
MONOS PCT: 7 %
Monocytes Absolute: 1.2 10*3/uL — ABNORMAL HIGH (ref 0.1–1.0)
NEUTROS ABS: 12.9 10*3/uL — AB (ref 1.7–7.7)
NEUTROS PCT: 75 %
Platelets: 301 10*3/uL (ref 150–400)
RBC: 4.82 MIL/uL (ref 3.87–5.11)
RDW: 12.5 % (ref 11.5–15.5)
WBC: 17.3 10*3/uL — ABNORMAL HIGH (ref 4.0–10.5)

## 2016-07-02 LAB — URINALYSIS, ROUTINE W REFLEX MICROSCOPIC
Bacteria, UA: NONE SEEN
Bilirubin Urine: NEGATIVE
GLUCOSE, UA: NEGATIVE mg/dL
Ketones, ur: NEGATIVE mg/dL
LEUKOCYTES UA: NEGATIVE
NITRITE: NEGATIVE
PH: 6 (ref 5.0–8.0)
PROTEIN: NEGATIVE mg/dL
RBC / HPF: NONE SEEN RBC/hpf (ref 0–5)
Specific Gravity, Urine: 1.002 — ABNORMAL LOW (ref 1.005–1.030)

## 2016-07-02 LAB — BASIC METABOLIC PANEL
Anion gap: 9 (ref 5–15)
BUN: 9 mg/dL (ref 6–20)
CHLORIDE: 106 mmol/L (ref 101–111)
CO2: 23 mmol/L (ref 22–32)
CREATININE: 0.7 mg/dL (ref 0.44–1.00)
Calcium: 9.3 mg/dL (ref 8.9–10.3)
GFR calc non Af Amer: >60 mL/min (ref >60)
Glucose, Bld: 87 mg/dL (ref 65–99)
POTASSIUM: 3.8 mmol/L (ref 3.5–5.1)
Sodium: 138 mmol/L (ref 135–145)

## 2016-07-02 LAB — HCG, QUANTITATIVE, PREGNANCY

## 2016-07-02 MED ORDER — FENTANYL CITRATE (PF) 100 MCG/2ML IJ SOLN
50.0000 ug | Freq: Once | INTRAMUSCULAR | Status: AC
  Administered 2016-07-02: 50 ug via INTRAVENOUS
  Filled 2016-07-02: qty 2

## 2016-07-02 MED ORDER — ONDANSETRON HCL 4 MG/2ML IJ SOLN
4.0000 mg | Freq: Once | INTRAMUSCULAR | Status: AC
  Administered 2016-07-02: 4 mg via INTRAVENOUS
  Filled 2016-07-02: qty 2

## 2016-07-02 MED ORDER — FLEET ENEMA 7-19 GM/118ML RE ENEM
1.0000 | ENEMA | Freq: Once | RECTAL | Status: AC
  Administered 2016-07-02: 1 via RECTAL

## 2016-07-02 MED ORDER — SODIUM CHLORIDE 0.9 % IV BOLUS (SEPSIS)
500.0000 mL | Freq: Once | INTRAVENOUS | Status: AC
  Administered 2016-07-02: 500 mL via INTRAVENOUS

## 2016-07-02 NOTE — Discharge Instructions (Signed)
Drink plenty of water and use a high-fiber diet.  Use MiraLAX 2 or 3 times a day until you have a soft bowel movement and gradually decrease, but MiraLAX, to 1-2 each day, for another week.  Continue to soak in a tub 2 or 3 times a day to improve your discomfort  Return here if needed for problems

## 2016-07-02 NOTE — ED Triage Notes (Signed)
Abdominal and rectal pain since this morning.   States she feels constipated.  Has taken a laxative po and suppository today.

## 2016-07-02 NOTE — ED Provider Notes (Signed)
[facility]Y DEPT Provider Note   CSN: [id number] Arrival date & time: 07/02/16  1428     History   Chief Complaint Chief Complaint  Patient presents with  . Abdominal Pain    HPI Dawn Martinez is a 26 y.o. female.  She presents for evaluation of abdominal pain, cramping in nature, and a sensation of stool obstructing her rectum and preventing her from having a bowel movement.  Her last bowel movement, was 3 days ago and soft.  She reports intermittent constipation in the past and a prior episode of fecal impaction.  She has nausea without vomiting.  She denies fever chills cough shortness of breath or upper back pain.  She has mild lower back pain.  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  Last menstrual period 06/05/2016.  She is not sure if she is pregnant.  She has been trying to eat new foods especially things like "seeds".    HPI  Past Medical History:  Diagnosis Date  . ADHD (attention deficit hyperactivity disorder)   . Anxiety     Patient Active Problem List   Diagnosis Date Noted  . ADHD (attention deficit hyperactivity disorder) 02/14/2015  . ASCUS favoring benign 11/03/2014  . History of anxiety 11/03/2014  . Changeable mood (HCC) 11/03/2014  . 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 Allergic rhinitis, seasonal 11/03/2014    Past Surgical History:  Procedure Laterality Date  . APPENDECTOMY      OB History    Gravida Para Term Preterm AB Living   0 0 0 0 0 0   SAB TAB Ectopic Multiple Live Births   0 0 0 0         Home Medications    Prior to Admission medications   Medication Sig Start Date End Date Taking? Authorizing Provider  acidophilus (RISAQUAD) CAPS capsule Take 1 capsule by mouth daily.   Yes Historical Provider, MD  Ascorbic Acid (VITAMIN C PO) Take 1 tablet by mouth daily.   Yes Historical Provider, MD  B Complex-C (B-COMPLEX WITH VITAMIN C) tablet Take 1 tablet by mouth daily.   Yes  Historical Provider, MD  bisacodyl (DULCOLAX) 10 MG suppository Place 10 mg rectally as needed for moderate constipation.   Yes Historical Provider, MD  bisacodyl (DULCOLAX) 5 MG EC tablet Take 10 mg by mouth daily as needed for moderate constipation.   Yes Historical Provider, MD  clonazePAM (KLONOPIN) 0.5 MG tablet 1/2 pill twice daily as needed for anxiety 04/24/16  Yes Anola Gurneyobert Chauvin, PA  docusate sodium (COLACE) 100 MG capsule Take 200 mg by mouth daily as needed for mild constipation.   Yes Historical Provider, MD  fluticasone (FLONASE) 50 MCG/ACT nasal spray Place 2 sprays into both nostrils daily. Patient taking differently: Place 2 sprays into both nostrils daily as needed for allergies.  02/25/16  Yes Anola Gurneyobert Chauvin, PA  HYDROcodone-homatropine [facility]HYCODAN) 5-1.5 MG/5ML syrup 5 ml 4-6 hours as needed for cough 05/01/16  Yes Anola Gurneyobert Chauvin, PA  ibuprofen (ADVIL,MOTRIN) 200 MG tablet Take 400-800 mg by mouth every 6 (six) hours as needed for headache or moderate pain.   Yes Historical Provider, MD  MELATONIN PO Take 1 tablet by mouth daily as needed (sleep).   Yes Historical Provider, MD  methylphenidate (RITALIN) 5 MG tablet Take 1 tablet (5 mg total) by mouth 2 (two) times daily. Take 30 minutes before a meal 02/25/16  Yes Anola Gurneyobert Chauvin, PA  naproxen sodium (  ANAPROX) 220 MG tablet Take 440 mg by mouth daily as needed (headache).   Yes Historical Provider, MD  Norethindrone Acetate-Ethinyl Estrad-FE (LOESTRIN 24 FE) 1-20 MG-MCG(24) tablet Take 1 tablet by mouth daily. 05/02/15  Yes Melody N Shambley, CNM  ondansetron (ZOFRAN) 8 MG tablet Take 1 tablet (8 mg total) by mouth every 8 (eight) hours as needed for nausea or vomiting. 04/24/16  Yes Anola Gurney, PA    Family History Family History  Problem Relation Age of Onset  . Depression Mother   . Anxiety disorder Mother   . Thyroid disease Mother   . ADD / ADHD Brother   . Gallbladder disease Sister   . Endometriosis Sister   . Heart  disease Father   . Diabetes Father   . Hypertension Father   . Congestive Heart Failure Father   . Prostate cancer Father   . Leukemia Father     Social History Social History  Substance Use Topics  . Smoking status: Former Smoker    Quit date: 11/18/2014  . Smokeless tobacco: Never Used  . Alcohol use No     Comment: occasional     Allergies   Cefaclor and Promethazine hcl   Review of Systems Review of Systems  All other systems reviewed and are negative.    Physical Exam Updated Vital Signs BP 117/82 (BP Location: Right Arm)   Pulse 75   Temp 97.8 F (36.6 C) (Oral)   Resp 18   Ht 5' (1.524 m)   Wt 145 lb (65.8 kg)   LMP 06/05/2016   SpO2 100%   BMI 28.32 kg/m   Physical Exam  Constitutional: She is oriented to person, place, and time. She appears well-developed. She appears distressed (Restless, uncomfortable).  Overweight  HENT:  Head: Normocephalic and atraumatic.  Eyes: Conjunctivae and EOM are normal. Pupils are equal, round, and reactive to light.  Neck: Normal range of motion and phonation normal. Neck supple.  Cardiovascular: Normal rate and regular rhythm.   Pulmonary/Chest: Effort normal and breath sounds normal. She exhibits no tenderness.  Abdominal: Soft. She exhibits no distension and no mass. There is no tenderness (Bilateral lower quadrants, mild). There is no guarding.  Genitourinary:  Genitourinary Comments: Normal external anus.  Mild pain on digital anal examination, no palpable internal hemorrhoids.  No fecal impaction.  Examination completed after enema was given.  Musculoskeletal: Normal range of motion.  Neurological: She is alert and oriented to person, place, and time. She exhibits normal muscle tone.  Skin: Skin is warm and dry.  Psychiatric: She has a normal mood and affect. Her behavior is normal. Judgment and thought content normal.  Nursing note and vitals reviewed.    ED Treatments / Results  Labs (all labs ordered are  listed, but only abnormal results are displayed) Labs Reviewed  CBC WITH DIFFERENTIAL/PLATELET - Abnormal; Notable for the following:       Result Value   WBC 17.3 (*)    Neutro Abs 12.9 (*)    Monocytes Absolute 1.2 (*)    All other components within normal limits  URINALYSIS, ROUTINE W REFLEX MICROSCOPIC - Abnormal; Notable for the following:    Color, Urine COLORLESS (*)    Specific Gravity, Urine 1.002 (*)    Hgb urine dipstick SMALL (*)    All other components within normal limits  BASIC METABOLIC PANEL  HCG, QUANTITATIVE, PREGNANCY    EKG  EKG Interpretation None       Radiology Dg Abdomen 1  View  Result Date: 07/02/2016 CLINICAL DATA:  Periumbilical abdominal pain for 1 day. Constipation. EXAM: ABDOMEN - 1 VIEW COMPARISON:  None. FINDINGS: No evidence of dilated bowel loops. Moderate colonic stool noted. No radio-opaque calculi or other significant radiographic abnormality are seen. IMPRESSION: No acute findings.  Moderate stool burden. Electronically Signed   By: Myles Rosenthal M.D.   On: 07/02/2016 19:14    Procedures Procedures (including critical care time)  Medications Ordered in ED Medications  sodium chloride 0.9 % bolus 500 mL (0 mLs Intravenous Stopped 07/02/16 1814)  fentaNYL (SUBLIMAZE) injection 50 mcg (50 mcg Intravenous Given 07/02/16 1650)  ondansetron (ZOFRAN) injection 4 mg (4 mg Intravenous Given 07/02/16 1659)  sodium phosphate (FLEET) 7-19 GM/118ML enema 1 enema (1 enema Rectal Given 07/02/16 1928)     Initial Impression / Assessment and Plan / ED Course  I have reviewed the triage vital signs and the nursing notes.  Pertinent labs & imaging results that were available during my care of the patient were reviewed by me and considered in my medical decision making (see chart for details).     Medications  sodium chloride 0.9 % bolus 500 mL (0 mLs Intravenous Stopped 07/02/16 1814)  fentaNYL (SUBLIMAZE) injection 50 mcg (50 mcg Intravenous Given  07/02/16 1650)  ondansetron (ZOFRAN) injection 4 mg (4 mg Intravenous Given 07/02/16 1659)  sodium phosphate (FLEET) 7-19 GM/118ML enema 1 enema (1 enema Rectal Given 07/02/16 1928)    Patient Vitals for the past 24 hrs:  BP Temp Temp src Pulse Resp SpO2 Height Weight  07/02/16 1702 117/82 - - 75 18 100 % - -  07/02/16 1437 - - - - - - 5' (1.524 m) 145 lb (65.8 kg)  07/02/16 1436 131/83 97.8 F (36.6 C) Oral 83 16 100 % - -    8:07 PM Reevaluation with update and discussion. After initial assessment and treatment, an updated evaluation reveals patient is more comfortable at this time.  Findings discussed with patient and mother, all questions answered. Dawn Martinez    Final Clinical Impressions(s) / ED Diagnoses   Final diagnoses:  Constipation, unspecified constipation type   Abdominal pain with constipation.  No apparent bowel obstruction.  Patient improved after manual disimpaction and enema administration.  Nursing Notes Reviewed/ Care Coordinated Applicable Imaging Reviewed Interpretation of Laboratory Data incorporated into ED treatment  The patient appears reasonably screened and/or stabilized for discharge and I doubt any other medical condition or other [facility] requiring further screening, evaluation, or treatment in the ED at this time prior to discharge.  Plan: Home Medications-MiraLAX 2-3 times a day; Home Treatments-high-fiber diet; return here if the recommended treatment, does not improve the symptoms; Recommended follow up-PCP, as needed   New Prescriptions New Prescriptions   No medications on file     Mancel Bale, MD 07/02/16 2009

## 2016-09-16 ENCOUNTER — Other Ambulatory Visit: Payer: Self-pay | Admitting: Family Medicine

## 2016-09-16 DIAGNOSIS — F411 Generalized anxiety disorder: Secondary | ICD-10-CM

## 2016-09-16 DIAGNOSIS — F909 Attention-deficit hyperactivity disorder, unspecified type: Secondary | ICD-10-CM

## 2016-09-16 NOTE — Telephone Encounter (Signed)
Pt contacted office for refill request on the following medications:  [facility].  CB#[phone]/MW  clonazePAM (KLONOPIN) 0.5 MG tablet  methylphenidate (RITALIN) 5 MG tablet  This is Bob's pt/MW

## 2016-09-18 MED ORDER — CLONAZEPAM 0.5 MG PO TABS: ORAL_TABLET | ORAL | 0 refills | Status: DC

## 2016-09-18 MED ORDER — METHYLPHENIDATE HCL 5 MG PO TABS: 5.0000 mg | ORAL_TABLET | Freq: Two times a day (BID) | ORAL | 0 refills | Status: DC

## 2016-10-01 ENCOUNTER — Telehealth: Payer: Self-pay | Admitting: Family Medicine

## 2017-02-02 ENCOUNTER — Other Ambulatory Visit: Payer: Self-pay | Admitting: Family Medicine

## 2017-02-02 ENCOUNTER — Ambulatory Visit (INDEPENDENT_AMBULATORY_CARE_PROVIDER_SITE_OTHER): Payer: 59 | Admitting: Family Medicine

## 2017-02-02 ENCOUNTER — Encounter: Payer: Self-pay | Admitting: Family Medicine

## 2017-02-02 VITALS — BP 122/80 | HR 83 | Temp 98.0°F | Resp 16 | Wt 150.4 lb

## 2017-02-02 DIAGNOSIS — K582 Mixed irritable bowel syndrome: Secondary | ICD-10-CM

## 2017-02-02 DIAGNOSIS — Z30011 Encounter for initial prescription of contraceptive pills: Secondary | ICD-10-CM | POA: Diagnosis not present

## 2017-02-02 DIAGNOSIS — K589 Irritable bowel syndrome without diarrhea: Secondary | ICD-10-CM | POA: Insufficient documentation

## 2017-02-02 DIAGNOSIS — F988 Other specified behavioral and emotional disorders with onset usually occurring in childhood and adolescence: Secondary | ICD-10-CM | POA: Diagnosis not present

## 2017-02-02 DIAGNOSIS — Z8659 Personal history of other mental and behavioral disorders: Secondary | ICD-10-CM | POA: Diagnosis not present

## 2017-02-02 LAB — POCT URINE PREGNANCY: PREG TEST UR: NEGATIVE

## 2017-02-02 MED ORDER — ONDANSETRON HCL 4 MG PO TABS: 4.0000 mg | ORAL_TABLET | Freq: Three times a day (TID) | ORAL | 1 refills | Status: DC | PRN

## 2017-02-02 MED ORDER — CLONAZEPAM 0.5 MG PO TABS: ORAL_TABLET | ORAL | 0 refills | Status: DC

## 2017-02-02 MED ORDER — NORETHIN ACE-ETH ESTRAD-FE 1-20 MG-MCG PO TABS: 1.0000 | ORAL_TABLET | Freq: Every day | ORAL | 11 refills | Status: DC

## 2017-02-02 MED ORDER — METHYLPHENIDATE HCL 5 MG PO TABS: 5.0000 mg | ORAL_TABLET | Freq: Two times a day (BID) | ORAL | 0 refills | Status: DC

## 2017-02-02 NOTE — Patient Instructions (Signed)
Try the FODMAP diet choices. Let me know how you are doing in one month when you need a refill on Ritalin.

## 2017-02-02 NOTE — Progress Notes (Signed)
Subjective:     Patient ID: Dawn Martinez, female   DOB: [birth year], 26 y.o.   MRN: [id number]  HPI  Chief Complaint  Patient presents with  . Contraception    Patient comes in office today to discuss oral contraception, patient states in past she been on a low estrogen birth control to help with menstrual cycle.  Wishes to refill prior contraceptive. She is up to date with her Pap smear per [facility]. Also wishes refill on her prior medication. Using 1/4 to 1/2 clonazepam daily. 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 She tends to have nausea associated with anxiety and wishes a refill of Zofran at a lower dose. Chronically has IBS sx with alternating constipation and diarrhea (ER visit in February for severe constipation). Currently works as an Insurance underwriter at [employer].   Review of Systems     Objective:   Physical Exam  Constitutional: She appears well-developed and well-nourished.       Assessment:    1. Encounter for initial prescription of contraceptive pills - POCT urine pregnancy - norethindrone-ethinyl estradiol (LOESTRIN FE 1/20) 1-20 MG-MCG tablet; Take 1 tablet by mouth daily.  Dispense: 1 Package; Refill: 11  2. History of anxiety - ondansetron (ZOFRAN) 4 MG tablet; Take 1 tablet (4 mg total) by mouth every 8 (eight) hours as needed for nausea or vomiting.  Dispense: 30 tablet; Refill: 1 - clonazePAM (KLONOPIN) 0.5 MG tablet; 1/2 pill twice daily as needed for anxiety  Dispense: 30 tablet; Refill: 0  3. Irritable bowel syndrome with both constipation and diarrhea: FODMAP diet sheet provided  4. Attention deficit disorder (ADD) without hyperactivity - methylphenidate (RITALIN) 5 MG tablet; Take 1 tablet (5 mg total) by mouth 2 (two) times daily. Take 30 minutes before a meal  Dispense: 60 tablet; Refill: 0    Plan:    Phone f/u in one month.

## 2017-03-10 ENCOUNTER — Encounter: Payer: Self-pay | Admitting: Family Medicine

## 2017-03-10 ENCOUNTER — Ambulatory Visit (INDEPENDENT_AMBULATORY_CARE_PROVIDER_SITE_OTHER): Payer: 59 | Admitting: Family Medicine

## 2017-03-10 VITALS — BP 112/70 | HR 77 | Temp 97.9°F | Resp 16 | Wt 153.6 lb

## 2017-03-10 DIAGNOSIS — J019 Acute sinusitis, unspecified: Secondary | ICD-10-CM

## 2017-03-10 MED ORDER — AMOXICILLIN-POT CLAVULANATE 875-125 MG PO TABS: 1.0000 | ORAL_TABLET | Freq: Two times a day (BID) | ORAL | 0 refills | Status: DC

## 2017-03-10 MED ORDER — FLUCONAZOLE 150 MG PO TABS: 150.0000 mg | ORAL_TABLET | Freq: Once | ORAL | 0 refills | Status: AC

## 2017-03-10 MED ORDER — HYDROCODONE-HOMATROPINE 5-1.5 MG/5ML PO SYRP
ORAL_SOLUTION | ORAL | 0 refills | Status: DC
Start: 2017-03-10 — End: 2017-06-19

## 2017-03-10 NOTE — Progress Notes (Signed)
Subjective:     Patient ID: Dawn Martinez, female   DOB: [birth year], 26 y.o.   MRN: [id number]  HPI  Chief Complaint  Patient presents with  . Cough    Patient comes in office today with complaints of cough x1 week and congestion and sinus pain and pressure x3 weeks. Patient reports the cough is sharp and painful and has caused her pain in her upper back, she states that she has bilateral ear pain and pressure as well with shortness of breath and wheezing. Patient has taken otc Mucinex, Sinusmax, Tylenol Cold/Flu and prescription Hydrocodone cough syrup.   Reports persistent clear to purulent PND with accompanying cough.   Review of Systems     Objective:   Physical Exam  Constitutional: She appears well-developed and well-nourished. No distress.  Ears: T.M's intact without inflammation Sinuses: non-tender Throat: no tonsillar enlargement or exudate Neck: no cervical adenopathy Lungs: clear except for transient posterior wheeze     Assessment:    1. Acute sinusitis, recurrence not specified, unspecified location - amoxicillin-clavulanate (AUGMENTIN) 875-125 MG tablet; Take 1 tablet by mouth 2 (two) times daily.  Dispense: 20 tablet; Refill: 0 - fluconazole (DIFLUCAN) 150 MG tablet; Take 1 tablet (150 mg total) by mouth once.  Dispense: 1 tablet; Refill: 0 - HYDROcodone-homatropine (HYCODAN) 5-1.5 MG/5ML syrup; 5 ml 4-6 hours as needed for cough  Dispense: 120 mL; Refill: 0    Plan:   Continue Mucinex and otc cold preparation.

## 2017-03-10 NOTE — Patient Instructions (Signed)
Continue Mucinex and sinus cold preparation.

## 2017-05-30 DIAGNOSIS — J111 Influenza due to unidentified influenza virus with other respiratory manifestations: Secondary | ICD-10-CM | POA: Diagnosis not present

## 2017-06-19 ENCOUNTER — Other Ambulatory Visit: Payer: Self-pay | Admitting: Family Medicine

## 2017-06-19 DIAGNOSIS — F988 Other specified behavioral and emotional disorders with onset usually occurring in childhood and adolescence: Secondary | ICD-10-CM

## 2017-06-19 MED ORDER — METHYLPHENIDATE HCL 5 MG PO TABS: 5.0000 mg | ORAL_TABLET | Freq: Two times a day (BID) | ORAL | 0 refills | Status: DC

## 2017-06-19 NOTE — Telephone Encounter (Signed)
Pt contacted office for refill request on the following medications:  methylphenidate (RITALIN) 5 MG tablet  3 months of Rx  Last Rx: 02/02/17  Please advise. Thanks TNP

## 2017-06-19 NOTE — Telephone Encounter (Signed)
Ritalin prescriptions up front for pickup

## 2017-06-19 NOTE — Telephone Encounter (Signed)
Patient advised.KW 

## 2017-06-19 NOTE — Telephone Encounter (Signed)
Please review last office visit was 02/02/17.KW

## 2017-07-13 ENCOUNTER — Encounter: Payer: Self-pay | Admitting: Family Medicine

## 2017-07-13 ENCOUNTER — Ambulatory Visit (INDEPENDENT_AMBULATORY_CARE_PROVIDER_SITE_OTHER): Payer: 59 | Admitting: Family Medicine

## 2017-07-13 VITALS — BP 114/72 | HR 68 | Temp 98.2°F | Resp 16 | Wt 160.0 lb

## 2017-07-13 DIAGNOSIS — B349 Viral infection, unspecified: Secondary | ICD-10-CM | POA: Diagnosis not present

## 2017-07-13 MED ORDER — AMOXICILLIN-POT CLAVULANATE 875-125 MG PO TABS
1.0000 | ORAL_TABLET | Freq: Two times a day (BID) | ORAL | 0 refills | Status: DC
Start: 2017-07-13 — End: 2017-09-21

## 2017-07-13 NOTE — Progress Notes (Signed)
Subjective:     Patient ID: Dawn Martinez, female   DOB: [birth year], 27 y.o.   MRN: [id number] Chief Complaint  Patient presents with  . Sinusitis    Started about a week ago.     HPI States she developed headaches, felt feverish, mild body aches, and now sinus pressure with green to bloody drainage. Minimal cough. Feels a bit better today as headaches have abated.  Review of Systems     Objective:   Physical Exam  Constitutional: She appears well-developed and well-nourished. No distress.  Ears: T.M's intact without inflammation Sinuses: mild maxillary and paranasal sinus tenderness Throat: no tonsillar enlargement or exudate Neck: no cervical adenopathy Lungs: clear     Assessment:    1. Viral syndrome - amoxicillin-clavulanate (AUGMENTIN) 875-125 MG tablet; Take 1 tablet by mouth 2 (two) times daily.  Dispense: 20 tablet; Refill: 0    Plan:    Discussed use of Mucinex D for congestion and Delsym for cough. Start abx if sinuses not clearing in the next couple of day.

## 2017-07-13 NOTE — Patient Instructions (Signed)
Discussed use of Mucinex D for congestion and Delsym for cough. If sinuses not continuing to improve start the antibiotic.

## 2017-09-21 ENCOUNTER — Ambulatory Visit (INDEPENDENT_AMBULATORY_CARE_PROVIDER_SITE_OTHER): Payer: 59 | Admitting: Family Medicine

## 2017-09-21 ENCOUNTER — Encounter: Payer: Self-pay | Admitting: Family Medicine

## 2017-09-21 ENCOUNTER — Ambulatory Visit: Payer: Self-pay | Admitting: Family Medicine

## 2017-09-21 ENCOUNTER — Other Ambulatory Visit: Payer: Self-pay | Admitting: Family Medicine

## 2017-09-21 VITALS — BP 124/84 | HR 71 | Temp 98.6°F | Resp 16 | Wt 152.0 lb

## 2017-09-21 DIAGNOSIS — F322 Major depressive disorder, single episode, severe without psychotic features: Secondary | ICD-10-CM | POA: Diagnosis not present

## 2017-09-21 MED ORDER — DULOXETINE HCL 20 MG PO CPEP: ORAL_CAPSULE | ORAL | 0 refills | Status: DC

## 2017-09-21 NOTE — Patient Instructions (Signed)
Call me if you can't tolerate the medication. Remember to start at just one pill a day for the first 5 days. You can continue to use clonazepam.

## 2017-09-21 NOTE — Progress Notes (Signed)
  Subjective:     Patient ID: Dawn Martinez, female   DOB: Oct 12, 1990, 27 y.o.   MRN: [id number] Chief Complaint  Patient presents with  . Depression    Patient comes in office today to address symptoms of depression that has gradually been getting worse over the pas 3-4 months. Patient reports crying spells, feelings od doubt, trouble sleeping, changes in appetite and anger outburst. Patient states that she has a history of PMDD and states that her angry outburst are worse even when she is not on cycles. Patient states " people tell me they have to walk around eggshells around me or tell me they dont say certain things to set me off."    HPI States it is affecting her relationships with her boyfriend and family and in her work life as a Engineer, civil (consulting). Denies suicidal ideation. in  2014-2015 was placed on several medications she did not tolerate: sertraline (felt suicidal), Lexapro, buproprion and buspirone. States she was drinking a couple of beers when she got home from work but has stopped drinking. No recreational drugs. Scores 23 on the PHQ-9 today.  Review of Systems     Objective:   Physical Exam  Constitutional: She appears well-developed and well-nourished. No distress.  Psychiatric: She has a normal mood and affect. Her behavior is normal.       Assessment:    1. Depression, major, single episode, severe (HCC): start duloxetine    Plan:    F/u in 3 weeks; consider referral if not tolerating medication.

## 2017-10-08 ENCOUNTER — Other Ambulatory Visit: Payer: Self-pay | Admitting: Family Medicine

## 2017-10-08 ENCOUNTER — Encounter: Payer: Self-pay | Admitting: Family Medicine

## 2017-10-08 MED ORDER — MIRTAZAPINE 15 MG PO TBDP: 15.0000 mg | ORAL_TABLET | Freq: Every day | ORAL | 0 refills | Status: DC

## 2017-10-12 ENCOUNTER — Ambulatory Visit: Payer: Self-pay | Admitting: Family Medicine

## 2017-10-20 ENCOUNTER — Encounter: Payer: Self-pay | Admitting: Family Medicine

## 2017-10-21 ENCOUNTER — Other Ambulatory Visit: Payer: Self-pay | Admitting: Family Medicine

## 2017-10-21 DIAGNOSIS — F988 Other specified behavioral and emotional disorders with onset usually occurring in childhood and adolescence: Secondary | ICD-10-CM

## 2017-10-21 MED ORDER — METHYLPHENIDATE HCL 5 MG PO TABS: 5.0000 mg | ORAL_TABLET | Freq: Two times a day (BID) | ORAL | 0 refills | Status: DC

## 2017-10-28 ENCOUNTER — Ambulatory Visit: Payer: Self-pay | Admitting: Family Medicine

## 2017-11-04 ENCOUNTER — Ambulatory Visit (INDEPENDENT_AMBULATORY_CARE_PROVIDER_SITE_OTHER): Payer: 59 | Admitting: Family Medicine

## 2017-11-04 ENCOUNTER — Encounter: Payer: Self-pay | Admitting: Family Medicine

## 2017-11-04 VITALS — BP 102/82 | HR 69 | Temp 97.6°F | Resp 15 | Wt 154.4 lb

## 2017-11-04 DIAGNOSIS — Z0000 Encounter for general adult medical examination without abnormal findings: Secondary | ICD-10-CM | POA: Diagnosis not present

## 2017-11-04 DIAGNOSIS — F988 Other specified behavioral and emotional disorders with onset usually occurring in childhood and adolescence: Secondary | ICD-10-CM | POA: Diagnosis not present

## 2017-11-04 DIAGNOSIS — J301 Allergic rhinitis due to pollen: Secondary | ICD-10-CM | POA: Diagnosis not present

## 2017-11-04 DIAGNOSIS — F322 Major depressive disorder, single episode, severe without psychotic features: Secondary | ICD-10-CM | POA: Diagnosis not present

## 2017-11-04 DIAGNOSIS — Z111 Encounter for screening for respiratory tuberculosis: Secondary | ICD-10-CM

## 2017-11-04 DIAGNOSIS — Z8659 Personal history of other mental and behavioral disorders: Secondary | ICD-10-CM

## 2017-11-04 DIAGNOSIS — K582 Mixed irritable bowel syndrome: Secondary | ICD-10-CM | POA: Diagnosis not present

## 2017-11-04 NOTE — Progress Notes (Signed)
  Subjective:     Patient ID: Dawn Martinez, female   DOB: 01/18/1991, 27 y.o.   MRN: [id number] Chief Complaint  Patient presents with  . Annual Exam    Patient comes in office today for her annual physical she states that she feels well today and has no concerns to address, patient states that she sees Gyn at [facility] for her pap examinations. Patient states that she follows a balanced diet and is staying active with work duties walking daily, patient averages between 5-6hrs of sleep a night.    HPI States she has not started Remeron for depression but feels a bit better emotionally. Wishes to update TB screen for work.  Review of Systems General: Feeling well HEENT: regular dental visits and eye exams (glasses). Allergies controlled with steroid nasal spray. Cardiovascular: no chest pain, shortness of breath, or palpitations GI: no heartburn, no change in bowel habits or blood in the stool. Tries to control her IBS with FODMAP food choices GU:  no change in bladder habits-reports hx of interstitial cystitis Psychiatric: PHQ 9: 12. Remains on Ritalin for ADD. Musculoskeletal: no joint pain    Objective:   Physical Exam  Constitutional: She appears well-developed and well-nourished. No distress.  Eyes: PERRLA Neck: no thyromegaly, tenderness or nodules, no cervical adenopathy ENT: TM's intact without inflammation; No tonsillar enlargement or exudate, Lungs: Clear Heart : RRR without murmur or gallop Abd: bowel sounds present, soft, non-tender, no organomegaly Extremities: no edema Skin: no atypical lesions noted on her back     Assessment:    1. Screening for tuberculosis - Quantiferon tb gold assay (blood)  2. 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 Depression, major, single episode, severe (HCC): start Remeron  4. History of anxiety: clonazepam prn  5. Irritable bowel syndrome with both constipation and diarrhea; dietary  measures 6.Seasonal allergic rhinitis due to pollen: continue steroid nasal spray  7.. Annual physical exam - Comprehensive metabolic panel - Lipid panel    Plan:    Further f/u pending lab work.

## 2017-11-04 NOTE — Patient Instructions (Signed)
We will call you with the lab results. Do start the Remeron and follow up in 3 weeks.

## 2017-11-05 ENCOUNTER — Telehealth: Payer: Self-pay

## 2017-11-05 NOTE — Telephone Encounter (Signed)
-----   Message from Anola Gurneyobert Chauvin, [place] sent at 11/05/2017  7:48 AM EDT ----- Labs good, TB test is pending.

## 2017-11-05 NOTE — Telephone Encounter (Signed)
Unable to leave voicemail on home/cell, voicemailbox is full, will try and reach patient again at a later time. KW

## 2017-11-08 LAB — QUANTIFERON-TB GOLD PLUS
QUANTIFERON NIL VALUE: 0.04 [IU]/mL
QUANTIFERON TB1 AG VALUE: 0.02 [IU]/mL
QUANTIFERON TB2 AG VALUE: 0.03 [IU]/mL
QuantiFERON Mitogen Value: >10 IU/mL
QuantiFERON-TB Gold Plus: NEGATIVE

## 2017-11-08 LAB — COMPREHENSIVE METABOLIC PANEL
ALK PHOS: 65 IU/L (ref 39–117)
ALT: 16 IU/L (ref 0–32)
AST: 18 IU/L (ref 0–40)
Albumin/Globulin Ratio: 2 (ref 1.2–2.2)
Albumin: 4.8 g/dL (ref 3.5–5.5)
BUN/Creatinine Ratio: 13 (ref 9–23)
BUN: 9 mg/dL (ref 6–20)
Bilirubin Total: 0.3 mg/dL (ref 0.0–1.2)
CHLORIDE: 104 mmol/L (ref 96–106)
CO2: 23 mmol/L (ref 20–29)
CREATININE: 0.7 mg/dL (ref 0.57–1.00)
Calcium: 9.7 mg/dL (ref 8.7–10.2)
GFR calc Af Amer: 136 mL/min/{1.73_m2} (ref >59)
GFR calc non Af Amer: 118 mL/min/{1.73_m2} (ref >59)
GLUCOSE: 84 mg/dL (ref 65–99)
Globulin, Total: 2.4 g/dL (ref 1.5–4.5)
Potassium: 4.4 mmol/L (ref 3.5–5.2)
Sodium: 141 mmol/L (ref 134–144)
Total Protein: 7.2 g/dL (ref 6.0–8.5)

## 2017-11-08 LAB — LIPID PANEL
CHOLESTEROL TOTAL: 147 mg/dL (ref 100–199)
Chol/HDL Ratio: 4.7 ratio — ABNORMAL HIGH (ref 0.0–4.4)
HDL: 31 mg/dL — AB (ref >39)
LDL CALC: 94 mg/dL (ref 0–99)
Triglycerides: 111 mg/dL (ref 0–149)
VLDL CHOLESTEROL CAL: 22 mg/dL (ref 5–40)

## 2017-11-09 ENCOUNTER — Encounter: Payer: Self-pay | Admitting: Family Medicine

## 2017-11-09 ENCOUNTER — Telehealth: Payer: Self-pay

## 2017-11-09 NOTE — Telephone Encounter (Signed)
Unable to reach patient at this time, voicemail box is full will try reaching patient at a later time. KW

## 2017-11-09 NOTE — Telephone Encounter (Signed)
-----   Message from Anola Gurneyobert Chauvin, [place] sent at 11/09/2017  7:25 AM EDT ----- TB test ok-give patient a copy if needed

## 2017-11-10 NOTE — Telephone Encounter (Signed)
Patient advised.KW 

## 2017-11-11 ENCOUNTER — Encounter: Payer: Self-pay | Admitting: Family Medicine

## 2017-11-24 ENCOUNTER — Other Ambulatory Visit: Payer: Self-pay | Admitting: Family Medicine

## 2017-11-24 ENCOUNTER — Encounter: Payer: Self-pay | Admitting: Family Medicine

## 2017-11-24 DIAGNOSIS — F988 Other specified behavioral and emotional disorders with onset usually occurring in childhood and adolescence: Secondary | ICD-10-CM

## 2017-12-15 ENCOUNTER — Other Ambulatory Visit: Payer: Self-pay | Admitting: Family Medicine

## 2017-12-15 ENCOUNTER — Other Ambulatory Visit: Payer: Self-pay

## 2017-12-15 DIAGNOSIS — Z8659 Personal history of other mental and behavioral disorders: Secondary | ICD-10-CM

## 2017-12-15 DIAGNOSIS — Z30011 Encounter for initial prescription of contraceptive pills: Secondary | ICD-10-CM

## 2017-12-15 DIAGNOSIS — F988 Other specified behavioral and emotional disorders with onset usually occurring in childhood and adolescence: Secondary | ICD-10-CM

## 2017-12-15 MED ORDER — CLONAZEPAM 0.5 MG PO TABS: ORAL_TABLET | ORAL | 0 refills | Status: DC

## 2017-12-15 MED ORDER — NORETHIN ACE-ETH ESTRAD-FE 1-20 MG-MCG PO TABS: 1.0000 | ORAL_TABLET | Freq: Every day | ORAL | 11 refills | Status: DC

## 2017-12-15 NOTE — Telephone Encounter (Signed)
Please review

## 2017-12-15 NOTE — Telephone Encounter (Signed)
Patient called requesting refills for clonazepam, methylphenidate, and birth control meds sent into [facility]. Thanks!

## 2017-12-18 ENCOUNTER — Other Ambulatory Visit: Payer: Self-pay | Admitting: Family Medicine

## 2017-12-18 DIAGNOSIS — F988 Other specified behavioral and emotional disorders with onset usually occurring in childhood and adolescence: Secondary | ICD-10-CM

## 2017-12-28 ENCOUNTER — Other Ambulatory Visit: Payer: Self-pay | Admitting: Family Medicine

## 2017-12-28 DIAGNOSIS — F988 Other specified behavioral and emotional disorders with onset usually occurring in childhood and adolescence: Secondary | ICD-10-CM

## 2018-01-20 ENCOUNTER — Other Ambulatory Visit: Payer: Self-pay | Admitting: Family Medicine

## 2018-01-20 DIAGNOSIS — F988 Other specified behavioral and emotional disorders with onset usually occurring in childhood and adolescence: Secondary | ICD-10-CM

## 2018-01-21 ENCOUNTER — Other Ambulatory Visit: Payer: Self-pay | Admitting: Family Medicine

## 2018-01-21 MED ORDER — METHYLPHENIDATE HCL 5 MG PO TABS: 5.0000 mg | ORAL_TABLET | Freq: Two times a day (BID) | ORAL | 0 refills | Status: DC

## 2018-02-11 ENCOUNTER — Encounter: Payer: Self-pay | Admitting: Family Medicine

## 2018-02-11 ENCOUNTER — Ambulatory Visit (INDEPENDENT_AMBULATORY_CARE_PROVIDER_SITE_OTHER): Payer: 59 | Admitting: Family Medicine

## 2018-02-11 VITALS — BP 100/60 | HR 102 | Temp 98.1°F | Resp 16 | Wt 143.2 lb

## 2018-02-11 DIAGNOSIS — Z9189 Other specified personal risk factors, not elsewhere classified: Secondary | ICD-10-CM

## 2018-02-11 DIAGNOSIS — J209 Acute bronchitis, unspecified: Secondary | ICD-10-CM

## 2018-02-11 LAB — POCT URINE PREGNANCY: Preg Test, Ur: NEGATIVE

## 2018-02-11 MED ORDER — HYDROCODONE-HOMATROPINE 5-1.5 MG/5ML PO SYRP: 5.0000 mL | ORAL_SOLUTION | Freq: Four times a day (QID) | ORAL | 0 refills | Status: DC | PRN

## 2018-02-11 MED ORDER — AMOXICILLIN-POT CLAVULANATE 875-125 MG PO TABS: 1.0000 | ORAL_TABLET | Freq: Two times a day (BID) | ORAL | 0 refills | Status: DC

## 2018-02-11 MED ORDER — HYDROCODONE-HOMATROPINE 5-1.5 MG/5ML PO SYRP: 5.0000 mL | ORAL_SOLUTION | Freq: Four times a day (QID) | ORAL | 0 refills | Status: AC | PRN

## 2018-02-11 MED ORDER — FLUCONAZOLE 150 MG PO TABS: 150.0000 mg | ORAL_TABLET | Freq: Once | ORAL | 0 refills | Status: AC

## 2018-02-11 NOTE — Patient Instructions (Signed)
Let me know if not improving. 

## 2018-02-11 NOTE — Progress Notes (Signed)
  Subjective:     Patient ID: Dawn Martinez, female   DOB: 10-14-1990, 27 y.o.   MRN: [id number] Chief Complaint  Patient presents with  . URI    Patient comes in today with upper respiratory tract infection symptoms. Patient states she was sick last Monday and it seems to be getting worse. Patient states symptoms are productive cough with yellowish/tan mucus and hot flashes.   HPI States she has nearly been sick 10 days.. Reports productive cough with purulent sputum and states her sinuses have not cleared. Also states she has missed some of her birth control doses and wishes a urine pregnancy.  Review of Systems     Objective:   Physical Exam  Constitutional: She appears well-developed and well-nourished. No distress.  Ears: T.M's intact without inflammation Sinuses: non-tender Throat: no tonsillar enlargement or exudate Neck: no cervical adenopathy Lungs: brief inspiratory wheezes in posterior lung fields.     Assessment:    1. Occasionally uses contraception - POCT urine pregnancy  2. Acute bronchitis, unspecified organism - amoxicillin-clavulanate (AUGMENTIN) 875-125 MG tablet; Take 1 tablet by mouth 2 (two) times daily.  Dispense: 20 tablet; Refill: 0 - fluconazole (DIFLUCAN) 150 MG tablet; Take 1 tablet (150 mg total) by mouth once for 1 dose.  Dispense: 1 tablet; Refill: 0 - HYDROcodone-homatropine (HYCODAN) 5-1.5 MG/5ML syrup; Take 5 mLs by mouth every 6 (six) hours as needed for up to 5 days. 5 ml 4-6 hours as needed for cough  Dispense: 100 mL; Refill: 0    Plan:    Further f/u prn not improving.

## 2018-02-22 ENCOUNTER — Other Ambulatory Visit: Payer: Self-pay | Admitting: Family Medicine

## 2018-02-22 DIAGNOSIS — F988 Other specified behavioral and emotional disorders with onset usually occurring in childhood and adolescence: Secondary | ICD-10-CM

## 2018-02-22 MED ORDER — METHYLPHENIDATE HCL 5 MG PO TABS: 5.0000 mg | ORAL_TABLET | Freq: Two times a day (BID) | ORAL | 0 refills | Status: DC

## 2018-03-23 ENCOUNTER — Encounter: Payer: Self-pay | Admitting: Family Medicine

## 2018-03-23 ENCOUNTER — Other Ambulatory Visit: Payer: Self-pay | Admitting: Family Medicine

## 2018-03-23 DIAGNOSIS — F988 Other specified behavioral and emotional disorders with onset usually occurring in childhood and adolescence: Secondary | ICD-10-CM

## 2018-03-23 MED ORDER — METHYLPHENIDATE HCL 5 MG PO TABS: 5.0000 mg | ORAL_TABLET | Freq: Two times a day (BID) | ORAL | 0 refills | Status: DC

## 2018-04-28 ENCOUNTER — Encounter: Payer: Self-pay | Admitting: Family Medicine

## 2018-04-29 ENCOUNTER — Other Ambulatory Visit: Payer: Self-pay | Admitting: Family Medicine

## 2018-04-29 DIAGNOSIS — F988 Other specified behavioral and emotional disorders with onset usually occurring in childhood and adolescence: Secondary | ICD-10-CM

## 2018-04-29 MED ORDER — METHYLPHENIDATE HCL 5 MG PO TABS: 5.0000 mg | ORAL_TABLET | Freq: Two times a day (BID) | ORAL | 0 refills | Status: DC

## 2018-06-03 ENCOUNTER — Encounter: Payer: Self-pay | Admitting: Family Medicine

## 2018-06-03 ENCOUNTER — Other Ambulatory Visit: Payer: Self-pay | Admitting: Family Medicine

## 2018-06-03 DIAGNOSIS — F988 Other specified behavioral and emotional disorders with onset usually occurring in childhood and adolescence: Secondary | ICD-10-CM

## 2018-06-03 MED ORDER — METHYLPHENIDATE HCL 5 MG PO TABS: 5.0000 mg | ORAL_TABLET | Freq: Two times a day (BID) | ORAL | 0 refills | Status: DC

## 2018-06-09 ENCOUNTER — Encounter: Payer: Self-pay | Admitting: Family Medicine

## 2018-06-09 ENCOUNTER — Other Ambulatory Visit: Payer: Self-pay | Admitting: Family Medicine

## 2018-06-09 MED ORDER — FLUCONAZOLE 150 MG PO TABS: 150.0000 mg | ORAL_TABLET | Freq: Once | ORAL | 1 refills | Status: AC

## 2018-07-02 ENCOUNTER — Other Ambulatory Visit: Payer: Self-pay | Admitting: Family Medicine

## 2018-07-02 ENCOUNTER — Encounter: Payer: Self-pay | Admitting: Family Medicine

## 2018-07-02 DIAGNOSIS — F988 Other specified behavioral and emotional disorders with onset usually occurring in childhood and adolescence: Secondary | ICD-10-CM

## 2018-07-02 MED ORDER — OSELTAMIVIR PHOSPHATE 75 MG PO CAPS: 75.0000 mg | ORAL_CAPSULE | Freq: Two times a day (BID) | ORAL | 0 refills | Status: DC

## 2018-07-02 MED ORDER — METHYLPHENIDATE HCL 5 MG PO TABS: 5.0000 mg | ORAL_TABLET | Freq: Two times a day (BID) | ORAL | 0 refills | Status: DC

## 2018-07-02 MED ORDER — HYDROCODONE-HOMATROPINE 5-1.5 MG/5ML PO SYRP: ORAL_SOLUTION | ORAL | 0 refills | Status: DC

## 2018-08-02 ENCOUNTER — Other Ambulatory Visit: Payer: Self-pay

## 2018-08-02 DIAGNOSIS — F988 Other specified behavioral and emotional disorders with onset usually occurring in childhood and adolescence: Secondary | ICD-10-CM

## 2018-08-02 NOTE — Telephone Encounter (Signed)
Was Dawn Martinez old patient and is switch to Dr. Leonard Schwartz. Patient is requesting medication refill on Ritalin 5 MG. L.O.V. 02/11/2018. Please advise.

## 2018-08-02 NOTE — Telephone Encounter (Signed)
Needs f/u visit - evisit

## 2018-08-04 NOTE — Telephone Encounter (Signed)
[facility] appointment schedule for 08/05/2018 @ 10:20 AM.

## 2018-08-05 ENCOUNTER — Encounter: Payer: Self-pay | Admitting: Family Medicine

## 2018-08-05 ENCOUNTER — Ambulatory Visit (INDEPENDENT_AMBULATORY_CARE_PROVIDER_SITE_OTHER): Payer: 59 | Admitting: Family Medicine

## 2018-08-05 DIAGNOSIS — F419 Anxiety disorder, unspecified: Secondary | ICD-10-CM | POA: Diagnosis not present

## 2018-08-05 DIAGNOSIS — F988 Other specified behavioral and emotional disorders with onset usually occurring in childhood and adolescence: Secondary | ICD-10-CM | POA: Diagnosis not present

## 2018-08-05 DIAGNOSIS — Z8659 Personal history of other mental and behavioral disorders: Secondary | ICD-10-CM | POA: Diagnosis not present

## 2018-08-05 MED ORDER — ONDANSETRON HCL 4 MG PO TABS: 4.0000 mg | ORAL_TABLET | Freq: Three times a day (TID) | ORAL | 1 refills | Status: DC | PRN

## 2018-08-05 MED ORDER — METHYLPHENIDATE HCL 5 MG PO TABS: 5.0000 mg | ORAL_TABLET | Freq: Two times a day (BID) | ORAL | 0 refills | Status: DC

## 2018-08-05 NOTE — Assessment & Plan Note (Signed)
Stable, chronic, well controlled Continue ritalin BID at current dose Denies any side effects F/u in 3 months

## 2018-08-05 NOTE — Progress Notes (Signed)
Patient: Dawn Martinez Female    DOB: 1991-01-15   28 y.o.   MRN: [id number] Visit Date: 08/05/2018  Today's Provider: Shirlee Latch, MD   Chief Complaint  Patient presents with  . ADD   Subjective:    Virtual Visit via Video Note  I connected with Dawn Martinez on 08/05/18 at 10:20 AM EDT by a video enabled telemedicine application and verified that I am speaking with the correct person using two identifiers.   I discussed the limitations of evaluation and management by telemedicine and the availability of in person appointments. The patient expressed understanding and agreed to proceed.   Patient location: home Provider location: [facility]   HPI ADD: Patient presents for a follow up. Last OV was over 1 year ago. Patient advised to continue Ritalin 5 mg daily. Has helped significantly with concentration and mood. She is in school for BSN currently and doing well despite it being all online.  Has had some issues with depression in the past.  Feels mood is good right now.  Not on SSRI - has tried several in the past.  She does have klonopin that she takes rarely for anxiety attacks.  Still has some from Rx in 12/2017.  At next visit, wants to have thyroid checked and joints.   Allergies  Allergen Reactions  . Cefaclor     hives  . Duloxetine     Nausea and G.I. upset  . Promethazine Hcl     hallucinations     Current Outpatient Medications:  .  acidophilus (RISAQUAD) CAPS capsule, Take 1 capsule by mouth daily., Disp: , Rfl:  .  Ascorbic Acid (VITAMIN C PO), Take 1 tablet by mouth daily., Disp: , Rfl:  .  B Complex-C (B-COMPLEX WITH VITAMIN C) tablet, Take 1 tablet by mouth daily., Disp: , Rfl:  .  clonazePAM (KLONOPIN) 0.5 MG tablet, 1/2 pill twice daily as needed for anxiety, Disp: 30 tablet, Rfl: 0 .  docusate sodium (COLACE) 100 MG capsule, Take 200 mg by mouth daily as needed for mild constipation., Disp: , Rfl:  .  fluticasone  (FLONASE) 50 MCG/ACT nasal spray, Place 2 sprays into both nostrils daily., Disp: 16 g, Rfl: 5 .  ibuprofen (ADVIL,MOTRIN) 200 MG tablet, Take 400-800 mg by mouth every 6 (six) hours as needed for headache or moderate pain., Disp: , Rfl:  .  methylphenidate (RITALIN) 5 MG tablet, Take 1 tablet (5 mg total) by mouth 2 (two) times daily with breakfast and lunch., Disp: 60 tablet, Rfl: 0 .  methylphenidate (RITALIN) 5 MG tablet, Take 1 tablet (5 mg total) by mouth 2 (two) times daily with breakfast and lunch., Disp: 60 tablet, Rfl: 0 .  methylphenidate (RITALIN) 5 MG tablet, Take 1 tablet (5 mg total) by mouth 2 (two) times daily with breakfast and lunch., Disp: 60 tablet, Rfl: 0 .  naproxen sodium (ANAPROX) 220 MG tablet, Take 440 mg by mouth daily as needed (headache)., Disp: , Rfl:  .  norethindrone-ethinyl estradiol (LOESTRIN FE 1/20) 1-20 MG-MCG tablet, Take 1 tablet by mouth daily., Disp: 1 Package, Rfl: 11 .  ondansetron (ZOFRAN) 4 MG tablet, Take 1 tablet (4 mg total) by mouth every 8 (eight) hours as needed for nausea or vomiting., Disp: 30 tablet, Rfl: 1  Review of Systems  Constitutional: Negative.   Respiratory: Negative.   Cardiovascular: Negative.   Musculoskeletal: Negative.   Psychiatric/Behavioral: Positive for decreased concentration.    Social  History   Tobacco Use  . Smoking status: Former Smoker    Last attempt to quit: 11/18/2014    Years since quitting: 3.7  . Smokeless tobacco: Never Used  Substance Use Topics  . Alcohol use: No    Alcohol/week: 0.0 standard drinks    Comment: occasional      Objective:   There were no vitals taken for this visit. There were no vitals filed for this visit.   Physical Exam Constitutional:      General: She is not in acute distress.    Appearance: Normal appearance. She is not diaphoretic.  HENT:     Head: Normocephalic and atraumatic.  Pulmonary:     Effort: Pulmonary effort is normal. No respiratory distress.   Neurological:     Mental Status: She is alert and oriented to person, place, and time.  Psychiatric:        Mood and Affect: Mood normal.        Behavior: Behavior normal.        Thought Content: Thought content normal.         Assessment & Plan   Problem List Items Addressed This Visit      Other   Anxiety    Chronic and stable Using klonopin very rarely No plan to escalate in the future      Relevant Medications   ondansetron (ZOFRAN) 4 MG tablet   ADD (attention deficit disorder) - Primary    Stable, chronic, well controlled Continue ritalin BID at current dose Denies any side effects F/u in 3 months      Relevant Medications   methylphenidate (RITALIN) 5 MG tablet       Return in about 3 months (around 11/05/2018) for CPE.   The entirety of the information documented in the History of Present Illness, Review of Systems and Physical Exam were personally obtained by me. Portions of this information were initially documented by Presley Raddle, CMA and reviewed by me for thoroughness and accuracy.   Follow Up Instructions:    I discussed the assessment and treatment plan with the patient. The patient was provided an opportunity to ask questions and all were answered. The patient agreed with the plan and demonstrated an understanding of the instructions.   The patient was advised to call back or seek an in-person evaluation if the symptoms worsen or if the condition fails to improve as anticipated.  I provided 25 minutes of non-face-to-face time during this encounter.  Erasmo Downer, MD, MPH [facility] 08/05/2018 12:54 PM

## 2018-08-05 NOTE — Assessment & Plan Note (Signed)
Chronic and stable Using klonopin very rarely No plan to escalate in the future

## 2018-11-09 ENCOUNTER — Telehealth: Payer: Self-pay

## 2018-11-09 NOTE — Telephone Encounter (Signed)
Coronavirus (COVID-19) Are you at risk?  Are you at risk for the Coronavirus (COVID-19)?  To be considered HIGH RISK for Coronavirus (COVID-19), you have to meet the following criteria:  . Traveled to [place], [place], [place], [place] or [place]; or in the [place] to [place], [place], [place], or [place]; and have fever, cough, and shortness of breath within the last 2 weeks of travel OR . Been in close contact with a person diagnosed with COVID-19 within the last 2 weeks and have fever, cough, and shortness of breath . IF YOU DO NOT MEET THESE CRITERIA, YOU ARE CONSIDERED LOW RISK FOR COVID-19.  What to do if you are HIGH RISK for COVID-19?  . If you are having a medical emergency, call 911. . Seek medical care right away. Before you go to a doctor's office, urgent care or emergency department, call ahead and tell them about your recent travel, contact with someone diagnosed with COVID-19, and your symptoms. You should receive instructions from your physician's office regarding next steps of care.  . When you arrive at healthcare provider, tell the healthcare staff immediately you have returned from visiting [place], [place], [place], [place] or [place]; or traveled in the [place] to [place], [place], [place], or [place]; in the last two weeks or you have been in close contact with a person diagnosed with COVID-19 in the last 2 weeks.   . Tell the health care staff about your symptoms: fever, cough and shortness of breath. . After you have been seen by a medical provider, you will be either: o Tested for (COVID-19) and discharged home on quarantine except to seek medical care if symptoms worsen, and asked to  - Stay home and avoid contact with others until you get your results (4-5 days)  - Avoid travel on public transportation if possible (such as bus, train, or airplane) or o Sent to the Emergency Department by EMS for evaluation, COVID-19 testing, and possible  admission depending on your condition and test results.  What to do if you are LOW RISK for COVID-19?  Reduce your risk of any infection by using the same precautions used for avoiding the common cold or flu:  . Wash your hands often with soap and warm water for at least 20 seconds.  If soap and water are not readily available, use an alcohol-based hand sanitizer with at least 60% alcohol.  . If coughing or sneezing, cover your mouth and nose by coughing or sneezing into the elbow areas of your shirt or coat, into a tissue or into your sleeve (not your hands). . Avoid shaking hands with others and consider head nods or verbal greetings only. . Avoid touching your eyes, nose, or mouth with unwashed hands.  . Avoid close contact with people who are sick. . Avoid places or events with large numbers of people in one location, like concerts or sporting events. . Carefully consider travel plans you have or are making. . If you are planning any travel outside or inside the [place], visit the CDC's Travelers' Health webpage for the latest health notices. . If you have some symptoms but not all symptoms, continue to monitor at home and seek medical attention if your symptoms worsen. . If you are having a medical emergency, call 911.   ADDITIONAL HEALTHCARE OPTIONS FOR PATIENTS  [facility] / e-Visit: [email or web address]         [facility]: [phone]  [facility]: [phone]                   [facility]: [phone]   Pre-screen negative, DM.   

## 2018-11-10 ENCOUNTER — Ambulatory Visit (INDEPENDENT_AMBULATORY_CARE_PROVIDER_SITE_OTHER): Payer: 59 | Admitting: Obstetrics and Gynecology

## 2018-11-10 ENCOUNTER — Encounter: Payer: Self-pay | Admitting: Obstetrics and Gynecology

## 2018-11-10 ENCOUNTER — Other Ambulatory Visit: Payer: Self-pay

## 2018-11-10 VITALS — BP 116/76 | HR 86 | Ht 60.0 in | Wt 154.6 lb

## 2018-11-10 DIAGNOSIS — N926 Irregular menstruation, unspecified: Secondary | ICD-10-CM | POA: Diagnosis not present

## 2018-11-10 LAB — POCT URINE PREGNANCY: Preg Test, Ur: POSITIVE — AB

## 2018-11-10 NOTE — Progress Notes (Signed)
  Subjective:     Patient ID: Dawn Martinez, female   DOB: [birth year], 28 y.o.   MRN: [id number]  HPI Here for pregnancy confirmation reports early LMP 09/29/2018 giving EDC 07/06/2019 and EGA [redacted]w[redacted]d. this is first pregnancy for her and spouse. Does report + home UPT on 10/28/2018 and mild nausea with fatigue. 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  Does take ritalin for ADD but has cut back to one tablet daily since + UPT.  Spouse and patients father are both identical twins.  Review of Systems  Gastrointestinal: Positive for nausea.  All other systems reviewed and are negative.      Objective:   Physical Exam A&Ox4 Well groomed female in no distress Blood pressure 116/76, pulse 86, height 5' (1.524 m), weight 154 lb 9.6 oz (70.1 kg), last menstrual period 10/02/2018. Body mass index is 30.19 kg/m.  UPT+    Assessment:     Missed menses BMI 30 ADD    Plan:     Congratulated on pregnancy, Discussed routine prenatal care and will do viability scan in 2 weeks with Nurse visit/labs. Then New OB PE with me at 11 weeks.  To stop ritalin at this time. Continue PNV. Discussed covid- precautions and pregnancy.   Melody Shambley,CNM

## 2018-11-10 NOTE — Patient Instructions (Signed)
First Trimester of Pregnancy The first trimester of pregnancy is from week 1 until the end of week 13 (months 1 through 3). A week after a sperm fertilizes an egg, the egg will implant on the wall of the uterus. This embryo will begin to develop into a baby. Genes from you and your partner will form the baby. The female genes will determine whether the baby will be a boy or a girl. At 6-8 weeks, the eyes and face will be formed, and the heartbeat can be seen on ultrasound. At the end of 12 weeks, all the baby's organs will be formed. Now that you are pregnant, you will want to do everything you can to have a healthy baby. Two of the most important things are to get good prenatal care and to follow your health care provider's instructions. Prenatal care is all the medical care you receive before the baby's birth. This care will help prevent, find, and treat any problems during the pregnancy and childbirth. Body changes during your first trimester Your body goes through many changes during pregnancy. The changes vary from woman to woman.  You may gain or lose a couple of pounds at first.  You may feel sick to your stomach (nauseous) and you may throw up (vomit). If the vomiting is uncontrollable, call your health care provider.  You may tire easily.  You may develop headaches that can be relieved by medicines. All medicines should be approved by your health care provider.  You may urinate more often. Painful urination may mean you have a bladder infection.  You may develop heartburn as a result of your pregnancy.  You may develop constipation because certain hormones are causing the muscles that push stool through your intestines to slow down.  You may develop hemorrhoids or swollen veins (varicose veins).  Your breasts may begin to grow larger and become tender. Your nipples may stick out more, and the tissue that surrounds them (areola) may become darker.  Your gums may bleed and may be  sensitive to brushing and flossing.  Dark spots or blotches (chloasma, mask of pregnancy) may develop on your face. This will likely fade after the baby is born.  Your menstrual periods will stop.  You may have a loss of appetite.  You may develop cravings for certain kinds of food.  You may have changes in your emotions from day to day, such as being excited to be pregnant or being concerned that something may go wrong with the pregnancy and baby.  You may have more vivid and strange dreams.  You may have changes in your hair. These can include thickening of your hair, rapid growth, and changes in texture. Some women also have hair loss during or after pregnancy, or hair that feels dry or thin. Your hair will most likely return to normal after your baby is born. What to expect at prenatal visits During a routine prenatal visit:  You will be weighed to make sure you and the baby are growing normally.  Your blood pressure will be taken.  Your abdomen will be measured to track your baby's growth.  The fetal heartbeat will be listened to between weeks 10 and 14 of your pregnancy.  Test results from any previous visits will be discussed. Your health care provider may ask you:  How you are feeling.  If you are feeling the baby move.  If you have had any abnormal symptoms, such as leaking fluid, bleeding, severe headaches, or abdominal   cramping.  If you are using any tobacco products, including cigarettes, chewing tobacco, and electronic cigarettes.  If you have any questions. Other tests that may be performed during your first trimester include:  Blood tests to find your blood type and to check for the presence of any previous infections. The tests will also be used to check for low iron levels (anemia) and protein on red blood cells (Rh antibodies). Depending on your risk factors, or if you previously had diabetes during pregnancy, you may have tests to check for high blood sugar  that affects pregnant women (gestational diabetes).  Urine tests to check for infections, diabetes, or protein in the urine.  An ultrasound to confirm the proper growth and development of the baby.  Fetal screens for spinal cord problems (spina bifida) and Down syndrome.  HIV (human immunodeficiency virus) testing. Routine prenatal testing includes screening for HIV, unless you choose not to have this test.  You may need other tests to make sure you and the baby are doing well. Follow these instructions at home: Medicines  Follow your health care provider's instructions regarding medicine use. Specific medicines may be either safe or unsafe to take during pregnancy.  Take a prenatal vitamin that contains at least 600 micrograms (mcg) of folic acid.  If you develop constipation, try taking a stool softener if your health care provider approves. Eating and drinking   Eat a balanced diet that includes fresh fruits and vegetables, whole grains, good sources of protein such as meat, eggs, or tofu, and low-fat dairy. Your health care provider will help you determine the amount of weight gain that is right for you.  Avoid raw meat and uncooked cheese. These carry germs that can cause birth defects in the baby.  Eating four or five small meals rather than three large meals a day may help relieve nausea and vomiting. If you start to feel nauseous, eating a few soda crackers can be helpful. Drinking liquids between meals, instead of during meals, also seems to help ease nausea and vomiting.  Limit foods that are high in fat and processed sugars, such as fried and sweet foods.  To prevent constipation: ? Eat foods that are high in fiber, such as fresh fruits and vegetables, whole grains, and beans. ? Drink enough fluid to keep your urine clear or pale yellow. Activity  Exercise only as directed by your health care provider. Most women can continue their usual exercise routine during  pregnancy. Try to exercise for 30 minutes at least 5 days a week. Exercising will help you: ? Control your weight. ? Stay in shape. ? Be prepared for labor and delivery.  Experiencing pain or cramping in the lower abdomen or lower back is a good sign that you should stop exercising. Check with your health care provider before continuing with normal exercises.  Try to avoid standing for long periods of time. Move your legs often if you must stand in one place for a long time.  Avoid heavy lifting.  Wear low-heeled shoes and practice good posture.  You may continue to have sex unless your health care provider tells you not to. Relieving pain and discomfort  Wear a good support bra to relieve breast tenderness.  Take warm sitz baths to soothe any pain or discomfort caused by hemorrhoids. Use hemorrhoid cream if your health care provider approves.  Rest with your legs elevated if you have leg cramps or low back pain.  If you develop varicose veins in   your legs, wear support hose. Elevate your feet for 15 minutes, 3-4 times a day. Limit salt in your diet. Prenatal care  Schedule your prenatal visits by the twelfth week of pregnancy. They are usually scheduled monthly at first, then more often in the last 2 months before delivery.  Write down your questions. Take them to your prenatal visits.  Keep all your prenatal visits as told by your health care provider. This is important. Safety  Wear your seat belt at all times when driving.  Make a list of emergency phone numbers, including numbers for family, friends, the hospital, and police and fire departments. General instructions  Ask your health care provider for a referral to a local prenatal education class. Begin classes no later than the beginning of month 6 of your pregnancy.  Ask for help if you have counseling or nutritional needs during pregnancy. Your health care provider can offer advice or refer you to specialists for help  with various needs.  Do not use hot tubs, steam rooms, or saunas.  Do not douche or use tampons or scented sanitary pads.  Do not cross your legs for long periods of time.  Avoid cat litter boxes and soil used by cats. These carry germs that can cause birth defects in the baby and possibly loss of the fetus by miscarriage or stillbirth.  Avoid all smoking, herbs, alcohol, and medicines not prescribed by your health care provider. Chemicals in these products affect the formation and growth of the baby.  Do not use any products that contain nicotine or tobacco, such as cigarettes and e-cigarettes. If you need help quitting, ask your health care provider. You may receive counseling support and other resources to help you quit.  Schedule a dentist appointment. At home, brush your teeth with a soft toothbrush and be gentle when you floss. Contact a health care provider if:  You have dizziness.  You have mild pelvic cramps, pelvic pressure, or nagging pain in the abdominal area.  You have persistent nausea, vomiting, or diarrhea.  You have a bad smelling vaginal discharge.  You have pain when you urinate.  You notice increased swelling in your face, hands, legs, or ankles.  You are exposed to fifth disease or chickenpox.  You are exposed to German measles (rubella) and have never had it. Get help right away if:  You have a fever.  You are leaking fluid from your vagina.  You have spotting or bleeding from your vagina.  You have severe abdominal cramping or pain.  You have rapid weight gain or loss.  You vomit blood or material that looks like coffee grounds.  You develop a severe headache.  You have shortness of breath.  You have any kind of trauma, such as from a fall or a car accident. Summary  The first trimester of pregnancy is from week 1 until the end of week 13 (months 1 through 3).  Your body goes through many changes during pregnancy. The changes vary from  woman to woman.  You will have routine prenatal visits. During those visits, your health care provider will examine you, discuss any test results you may have, and talk with you about how you are feeling. This information is not intended to replace advice given to you by your health care provider. Make sure you discuss any questions you have with your health care provider. Document Released: 04/22/2001 Document Revised: 04/10/2017 Document Reviewed: 04/09/2016 Elsevier Patient Education  2020 [employer]  

## 2018-11-24 ENCOUNTER — Ambulatory Visit (INDEPENDENT_AMBULATORY_CARE_PROVIDER_SITE_OTHER): Payer: 59

## 2018-11-24 ENCOUNTER — Other Ambulatory Visit: Payer: Self-pay

## 2018-11-24 DIAGNOSIS — N926 Irregular menstruation, unspecified: Secondary | ICD-10-CM

## 2018-11-24 DIAGNOSIS — Z3687 Encounter for antenatal screening for uncertain dates: Secondary | ICD-10-CM

## 2018-11-26 ENCOUNTER — Telehealth: Payer: Self-pay | Admitting: Obstetrics and Gynecology

## 2018-11-26 NOTE — Telephone Encounter (Signed)
MNS sent mcm 

## 2018-11-26 NOTE — Telephone Encounter (Signed)
The pt called and stated that she wants to know why she did not receive a call with the results of he ru/s and also that she is wanting to know why she did not get a disc or print out. I informed the pt that we normally do not give disc's at that particular u/s and we do NOT give out prints of imaging. Please advise.

## 2018-12-10 ENCOUNTER — Other Ambulatory Visit: Payer: Self-pay

## 2018-12-10 ENCOUNTER — Telehealth: Payer: Self-pay

## 2018-12-10 ENCOUNTER — Ambulatory Visit (INDEPENDENT_AMBULATORY_CARE_PROVIDER_SITE_OTHER): Payer: 59 | Admitting: Family Medicine

## 2018-12-10 ENCOUNTER — Encounter: Payer: Self-pay | Admitting: Family Medicine

## 2018-12-10 DIAGNOSIS — Z3481 Encounter for supervision of other normal pregnancy, first trimester: Secondary | ICD-10-CM | POA: Diagnosis not present

## 2018-12-10 DIAGNOSIS — F332 Major depressive disorder, recurrent severe without psychotic features: Secondary | ICD-10-CM

## 2018-12-10 DIAGNOSIS — Z3685 Encounter for antenatal screening for Streptococcus B: Secondary | ICD-10-CM | POA: Diagnosis not present

## 2018-12-10 DIAGNOSIS — F411 Generalized anxiety disorder: Secondary | ICD-10-CM

## 2018-12-10 LAB — OB RESULTS CONSOLE GC/CHLAMYDIA
Chlamydia: NEGATIVE
Gonorrhea: NEGATIVE

## 2018-12-10 LAB — OB RESULTS CONSOLE HIV ANTIBODY (ROUTINE TESTING): HIV: NONREACTIVE

## 2018-12-10 LAB — OB RESULTS CONSOLE RUBELLA ANTIBODY, IGM: Rubella: IMMUNE

## 2018-12-10 LAB — OB RESULTS CONSOLE ANTIBODY SCREEN: Antibody Screen: NEGATIVE

## 2018-12-10 LAB — OB RESULTS CONSOLE ABO/RH: RH Type: POSITIVE

## 2018-12-10 LAB — OB RESULTS CONSOLE RPR: RPR: NONREACTIVE

## 2018-12-10 LAB — OB RESULTS CONSOLE HEPATITIS B SURFACE ANTIGEN: Hepatitis B Surface Ag: NEGATIVE

## 2018-12-10 MED ORDER — CITALOPRAM HYDROBROMIDE 20 MG PO TABS: 20.0000 mg | ORAL_TABLET | Freq: Every day | ORAL | 3 refills | Status: DC

## 2018-12-10 NOTE — Patient Instructions (Signed)

## 2018-12-10 NOTE — Telephone Encounter (Signed)
Patient called stating that she was having trouble focusing due to her anxiety. She is currently [redacted] weeks pregnant and has been off her medication for about a month. Spoke via chat to Dr. Brita Romp, and was advised to scheduled a virtual appointment for this afternoon at 1:10pm. Appointment scheduled, and patient notified.

## 2018-12-10 NOTE — Progress Notes (Signed)
Patient: Dawn Martinez Female    DOB: 10/01/1990   28 y.o.   MRN: [id number] Visit Date: 12/10/2018  Today's Provider: Shirlee LatchAngela Bacigalupo, MD   Chief Complaint  Patient presents with  . Anxiety   Subjective:     Virtual Visit via Video Note  I connected with Dawn Martinez on 12/10/18 at  1:00 PM EDT by a video enabled telemedicine application and verified that I am speaking with the correct person using two identifiers.   Patient location: home [facility] Persons involved in the visit: patient, provider   I discussed the limitations of evaluation and management by telemedicine and the availability of in person appointments. The patient expressed understanding and agreed to proceed.   HPI   Patient states that she is experiencing anxiety due to stopping her medication (Klonopin) because of her pregnancy She states that is hard for her to focus. She is currently [redacted] weeks pregnant. She was advised by her obstetrician to consult with her primary care physician about how she is feeling.   Started new job in October and in a new marriage  Focus is worse since stopping Ritalin for pregnancy  Feeling that other people would be better off without her No plan   Zoloft gave SI when tried in the past  Dep[facility] 2[facility] 12/10/2018 11/04/2017 09/21/2017  Decreased Interest 0 1 3  Down, Depressed, Hopeless 2 1 3   PHQ - 2 Score 2 2 6   Altered sleeping 1 2 2   Tired, decreased energy 3 3 3   Change in appetite 0 1 2  Feeling bad or failure about yourself  3 1 3   Trouble concentrating 2 2 2   Moving slowly or fidgety/restless 0 1 1  Suicidal thoughts 2 0 1  PHQ-9 Score 13 12 20   Difficult doing work/chores Extremely dIfficult Somewhat difficult Extremely dIfficult     GAD 7 : Generalized Anxiety Score 12/10/2018  Nervous, Anxious, on Edge 3  Control/stop worrying 3  Worry too much - different things 2  Trouble relaxing 3   Restless 1  Easily annoyed or irritable 3  Afraid - awful might happen 1  Total GAD 7 Score 16  Anxiety Difficulty Extremely difficult       Allergies  Allergen Reactions  . Cefaclor     hives  . Duloxetine     Nausea and G.I. upset  . Promethazine Hcl     hallucinations     Current Outpatient Medications:  .  ondansetron (ZOFRAN) 4 MG tablet, Take 1 tablet (4 mg total) by mouth every 8 (eight) hours as needed for nausea or vomiting., Disp: 30 tablet, Rfl: 1 .  docusate sodium (COLACE) 100 MG capsule, Take 200 mg by mouth daily as needed for mild constipation., Disp: , Rfl:  .  fluticasone (FLONASE) 50 MCG/ACT nasal spray, Place 2 sprays into both nostrils daily. (Patient not taking: Reported on 12/10/2018), Disp: 16 g, Rfl: 5 .  ibuprofen (ADVIL,MOTRIN) 200 MG tablet, Take 400-800 mg by mouth every 6 (six) hours as needed for headache or moderate pain., Disp: , Rfl:  .  methylphenidate (RITALIN) 5 MG tablet, Take 1 tablet (5 mg total) by mouth 2 (two) times daily with breakfast and lunch. (Patient not taking: Reported on 12/10/2018), Disp: 60 tablet, Rfl: 0 .  methylphenidate (RITALIN) 5 MG tablet, Take 1 tablet (5 mg total) by mouth 2 (two) times daily with breakfast and lunch. (Patient not taking:  Reported on 11/10/2018), Disp: 60 tablet, Rfl: 0  Review of Systems  All other systems reviewed and are negative.   Social History   Tobacco Use  . Smoking status: Former Smoker    Quit date: 11/18/2014    Years since quitting: 4.0  . Smokeless tobacco: Never Used  Substance Use Topics  . Alcohol use: No    Alcohol/week: 0.0 standard drinks    Comment: occasional      Objective:   LMP 10/02/2018  There were no vitals filed for this visit.   Physical Exam Constitutional:      General: She is not in acute distress.    Appearance: Normal appearance.  HENT:     Head: Normocephalic and atraumatic.  Pulmonary:     Effort: Pulmonary effort is normal. No respiratory distress.   Neurological:     Mental Status: She is alert and oriented to person, place, and time. Mental status is at baseline.  Psychiatric:        Mood and Affect: Mood is anxious and depressed. Affect is tearful.        Speech: Speech normal.        Behavior: Behavior normal.        Thought Content: Thought content includes suicidal ideation. Thought content does not include homicidal ideation. Thought content does not include homicidal or suicidal plan.      No results found for any visits on 12/10/18.     Assessment & Plan     I discussed the assessment and treatment plan with the patient. The patient was provided an opportunity to ask questions and all were answered. The patient agreed with the plan and demonstrated an understanding of the instructions.   The patient was advised to call back or seek an in-person evaluation if the symptoms worsen or if the condition fails to improve as anticipated.   Problem List Items Addressed This Visit      Other   GAD (generalized anxiety disorder) - Primary    Chronic and uncontrolled Exacerbated by pregnancy and other stressors/changes recently Discussed importance of good control of anxiety and depression in pregnancy Encouraged therapy and discussed synergism with medications Discussed avoidance of benzos in pregnancy Will Start Celexa 20 mg daily -discussed pregnancy risk factor Discussed potential side effects, incl GI upset, sexual dysfunction, increased anxiety, and SI Discussed that it can take 6-8 weeks to reach full efficacy Contracted for safety - no SI/HI F/u in 1 month Repeat PHQ 9 and GAD 7 at next visit and consider dose titration      Relevant Medications   citalopram (CELEXA) 20 MG tablet   MDD (major depressive disorder)    Recurrent problem She had previously been well controlled without medication uncontrolled Exacerbated by pregnancy and other stressors/changes recently Discussed importance of good control of  anxiety and depression in pregnancy Encouraged therapy and discussed synergism with medications Will Start Celexa 20 mg daily -discussed pregnancy risk factor Discussed potential side effects, incl GI upset, sexual dysfunction, increased anxiety, and SI Discussed that it can take 6-8 weeks to reach full efficacy Contracted for safety - no SI/HI F/u in 1 month Repeat PHQ 9 and GAD 7 at next visit and consider dose titration      Relevant Medications   citalopram (CELEXA) 20 MG tablet       Return in about 4 weeks (around 01/07/2019) for MDD/GAD f/u.   The entirety of the information documented in the History of Present Illness, Review of  Systems and Physical Exam were personally obtained by me. Portions of this information were initially documented by [facility] CMA and reviewed by me for thoroughness and accuracy.    Bacigalupo, Dionne Bucy, MD MPH [facility]

## 2018-12-11 ENCOUNTER — Ambulatory Visit: Payer: Self-pay | Admitting: Family Medicine

## 2018-12-13 ENCOUNTER — Telehealth: Payer: Self-pay | Admitting: Family Medicine

## 2018-12-13 DIAGNOSIS — Z8659 Personal history of other mental and behavioral disorders: Secondary | ICD-10-CM

## 2018-12-13 NOTE — Telephone Encounter (Signed)
Fri pt took thecitalopram (CELEXA) 20 MG tablet and Sat was vomiting Halfed the citalopram (CELEXA) 20 MG tablet  pill and was not as nauseated. Took a 10 mg last night and vomited.  Pt is asking if something could be called in for her for nausea that is safe for her pregnancy. Please call pt back to let her know what Dr. Jacinto Reap. Advises.  Pt uses: [place], [place] [phone] (Phone) [phone] (Fax)    Thanks, [place]

## 2018-12-14 MED ORDER — ONDANSETRON HCL 4 MG PO TABS: 4.0000 mg | ORAL_TABLET | Freq: Three times a day (TID) | ORAL | 0 refills | Status: DC | PRN

## 2018-12-14 NOTE — Telephone Encounter (Signed)
Pt returned missed call.  Pt is off of work today, so she can be called anytime.  Thanks, [employer]

## 2018-12-14 NOTE — Telephone Encounter (Signed)
LVMTRC 

## 2018-12-14 NOTE — Telephone Encounter (Signed)
Typically would Rx Phenergan in pregnancy, but patient has allergy to this. Zofran can be used sparingly in pregnancy (Rx sent).  Recommend she discuss with her OB as well.  Taking the Celexa at bedtime and/or with food can help with the N/V.  Go down to the 10mg  dose and stay at that for the time being.  N/V is often a hump to get over when starting these medications and if you can bear it for ~2 wks, it usually passes.

## 2018-12-14 NOTE — Telephone Encounter (Signed)
Patient advised.

## 2018-12-16 NOTE — Assessment & Plan Note (Addendum)
Chronic and uncontrolled Exacerbated by pregnancy and other stressors/changes recently Discussed importance of good control of anxiety and depression in pregnancy Encouraged therapy and discussed synergism with medications Discussed avoidance of benzos in pregnancy Will Start Celexa 20 mg daily -discussed pregnancy risk factor Discussed potential side effects, incl GI upset, sexual dysfunction, increased anxiety, and SI Discussed that it can take 6-8 weeks to reach full efficacy Contracted for safety - no SI/HI F/u in 1 month Repeat PHQ 9 and GAD 7 at next visit and consider dose titration

## 2018-12-16 NOTE — Assessment & Plan Note (Addendum)
Recurrent problem She had previously been well controlled without medication uncontrolled Exacerbated by pregnancy and other stressors/changes recently Discussed importance of good control of anxiety and depression in pregnancy Encouraged therapy and discussed synergism with medications Will Start Celexa 20 mg daily -discussed pregnancy risk factor Discussed potential side effects, incl GI upset, sexual dysfunction, increased anxiety, and SI Discussed that it can take 6-8 weeks to reach full efficacy Contracted for safety - no SI/HI F/u in 1 month Repeat PHQ 9 and GAD 7 at next visit and consider dose titration

## 2018-12-22 ENCOUNTER — Encounter: Payer: 59 | Admitting: Obstetrics and Gynecology

## 2018-12-24 DIAGNOSIS — Z331 Pregnant state, incidental: Secondary | ICD-10-CM | POA: Diagnosis not present

## 2018-12-24 DIAGNOSIS — Z124 Encounter for screening for malignant neoplasm of cervix: Secondary | ICD-10-CM | POA: Diagnosis not present

## 2018-12-24 DIAGNOSIS — Z113 Encounter for screening for infections with a predominantly sexual mode of transmission: Secondary | ICD-10-CM | POA: Diagnosis not present

## 2018-12-24 DIAGNOSIS — Z3480 Encounter for supervision of other normal pregnancy, unspecified trimester: Secondary | ICD-10-CM | POA: Diagnosis not present

## 2018-12-24 DIAGNOSIS — Z3400 Encounter for supervision of normal first pregnancy, unspecified trimester: Secondary | ICD-10-CM | POA: Diagnosis not present

## 2018-12-27 DIAGNOSIS — Z36 Encounter for antenatal screening for chromosomal anomalies: Secondary | ICD-10-CM | POA: Diagnosis not present

## 2018-12-27 DIAGNOSIS — Z3A12 12 weeks gestation of pregnancy: Secondary | ICD-10-CM | POA: Diagnosis not present

## 2018-12-27 DIAGNOSIS — Z3682 Encounter for antenatal screening for nuchal translucency: Secondary | ICD-10-CM | POA: Diagnosis not present

## 2019-01-12 ENCOUNTER — Ambulatory Visit (INDEPENDENT_AMBULATORY_CARE_PROVIDER_SITE_OTHER): Payer: 59 | Admitting: Family Medicine

## 2019-01-12 DIAGNOSIS — B86 Scabies: Secondary | ICD-10-CM

## 2019-01-12 MED ORDER — PERMETHRIN 5 % EX CREA: 1.0000 "application " | TOPICAL_CREAM | Freq: Once | CUTANEOUS | 0 refills | Status: AC

## 2019-01-12 NOTE — Patient Instructions (Signed)
Scabies, Adult  Scabies is a skin condition that happens when very small insects get under the skin (infestation). This causes a rash and severe itchiness. Scabies can spread from person to person (is contagious). If you get scabies, it is common for others in your household to get scabies too. With proper treatment, symptoms usually go away in 2-4 weeks. Scabies usually does not cause lasting problems. What are the causes? This condition is caused by tiny mites (Sarcoptes scabiei, or human itch mites) that can only be seen with a microscope. The mites get into the top layer of skin and lay eggs. Scabies can spread from person to person through:  Close contact with a person who has scabies.  Sharing or having contact with infested items, such as towels, bedding, or clothing. What increases the risk? The following factors may make you more likely to develop this condition:  Living in a nursing home or other extended care facility.  Having sexual contact with a partner who has scabies.  Caring for others who are at increased risk for scabies. What are the signs or symptoms? Symptoms of this condition include:  Severe itchiness. This is often worse at night.  A rash that includes tiny red bumps or blisters. The rash commonly occurs on the hands, wrists, elbows, armpits, chest, waist, groin, or buttocks. The bumps may form a line (burrow) in some areas.  Skin irritation. This can include scaly patches or sores. How is this diagnosed? This condition may be diagnosed based on:  A physical exam of the skin.  A skin test. Your health care provider may take a sample of your affected skin (skin scraping) and have it examined under a microscope for signs of mites. How is this treated? This condition may be treated with:  Medicated cream or lotion that kills the mites. This is spread on the entire body and left on for several hours. Usually, one treatment with medicated cream or lotion is  enough to kill all the mites. In severe cases, the treatment may need to be repeated.  Medicated cream that relieves itching.  Medicines taken by mouth (orally) that: ? Relieve itching. ? Reduce the swelling and redness. ? Kill the mites. This treatment may be done in severe cases. Follow these instructions at home: Medicines   Take or apply over-the-counter and prescription medicines as told by your health care provider.  Apply medicated cream or lotion as told by your health care provider.  Do not wash off the medicated cream or lotion until the necessary amount of time has passed. Skin care   Avoid scratching the affected areas of your skin.  Keep your fingernails closely trimmed to reduce injury from scratching.  Take cool baths or apply cool washcloths to your skin to help reduce itching. General instructions  Clean all items that you recently had contact with, including bedding, clothing, and furniture. Do this on the same day that you start treatment. ? Dry clean items, or use hot water to wash items. Dry items on the hot dry cycle. ? Place items that cannot be washed into closed, airtight plastic bags for at least 3 days. The mites cannot live for more than 3 days away from human skin. ? Vacuum furniture and mattresses that you use.  Make sure that other people who may have been infested are examined by a health care provider. These include members of your household and anyone who may have had contact with infested items.  Keep all follow-up   visits as told by your health care provider. This is important. Contact a health care provider if:  You have itching that does not go away after 4 weeks of treatment.  You continue to develop new bumps or burrows.  You have redness, swelling, or pain in your rash area after treatment.  You have fluid, blood, or pus coming from your rash. Summary  Scabies is a skin condition that causes a rash and severe itchiness.  This  condition is caused by tiny mites that get into the top layer of the skin and lay eggs.  Scabies can spread from person to person.  Follow treatments as recommended by your health care provider.  Clean all items that you recently had contact with. This information is not intended to replace advice given to you by your health care provider. Make sure you discuss any questions you have with your health care provider. Document Released: 01/17/2015 Document Revised: 03/03/2018 Document Reviewed: 03/03/2018 Elsevier Patient Education  2020 [employer]  

## 2019-01-12 NOTE — Progress Notes (Signed)
Patient: Dawn Martinez Female    DOB: 06/08/1990   28 y.o.   MRN: [id number] Visit Date: 01/12/2019  Today's Provider: [name], MD   Chief Complaint  Patient presents with  . Rash   Subjective:    I, Dawn Martinez CMA, am acting as a scribe for [name], MD.   Virtual Visit via Video Note  I connected with Dawn Martinez on 01/12/19 at  3:40 PM EDT by a video enabled telemedicine application and verified that I am speaking with the correct person using two identifiers.   Patient location: car Provider location: [facility] Persons involved in the visit: patient, provider   I discussed the limitations of evaluation and management by telemedicine and the availability of in person appointments. The patient expressed understanding and agreed to proceed.  Rash The current episode started in the past 7 days. The problem has been gradually worsening since onset. The affected locations include the left foot and right arm. The rash is characterized by itchiness and scaling. She was exposed to nothing (Step son has a rash ). Pertinent negatives include no eye pain or shortness of breath. Past treatments include nothing. The treatment provided no relief.   Dawn Martinez was diagnosed with scabies. Her mother also has scabies.  Allergies  Allergen Reactions  . Cefaclor     hives  . Duloxetine     Nausea and G.I. upset  . Promethazine Hcl     hallucinations     Current Outpatient Medications:  .  citalopram (CELEXA) 20 MG tablet, Take 1 tablet (20 mg total) by mouth daily., Disp: 30 tablet, Rfl: 3 .  docusate sodium (COLACE) 100 MG capsule, Take 200 mg by mouth daily as needed for mild constipation., Disp: , Rfl:  .  ibuprofen (ADVIL,MOTRIN) 200 MG tablet, Take 400-800 mg by mouth every 6 (six) hours as needed for headache or moderate pain., Disp: , Rfl:  .  ondansetron (ZOFRAN) 4 MG tablet, Take 1 tablet (4 mg total) by mouth every 8  (eight) hours as needed for nausea or vomiting., Disp: 30 tablet, Rfl: 0 .  fluticasone (FLONASE) 50 MCG/ACT nasal spray, Place 2 sprays into both nostrils daily. (Patient not taking: Reported on 12/10/2018), Disp: 16 g, Rfl: 5 .  methylphenidate (RITALIN) 5 MG tablet, Take 1 tablet (5 mg total) by mouth 2 (two) times daily with breakfast and lunch. (Patient not taking: Reported on 12/10/2018), Disp: 60 tablet, Rfl: 0 .  methylphenidate (RITALIN) 5 MG tablet, Take 1 tablet (5 mg total) by mouth 2 (two) times daily with breakfast and lunch. (Patient not taking: Reported on 11/10/2018), Disp: 60 tablet, Rfl: 0  Review of Systems  Constitutional: Negative.   Eyes: Negative for pain.  Respiratory: Negative.  Negative for shortness of breath.   Cardiovascular: Negative.   Skin: Positive for rash.    Social History   Tobacco Use  . Smoking status: Former Smoker    Quit date: 11/18/2014    Years since quitting: 4.1  . Smokeless tobacco: Never Used  Substance Use Topics  . Alcohol use: No    Alcohol/week: 0.0 standard drinks    Comment: occasional      Objective:   LMP 10/02/2018  There were no vitals filed for this visit.There is no height or weight on file to calculate BMI.   Physical Exam Constitutional:      Appearance: Normal appearance.  HENT:     Head: Normocephalic and  atraumatic.  Pulmonary:     Effort: Pulmonary effort is normal. No respiratory distress.  Skin:    General: Skin is warm and dry.     Findings: Rash (small erythematous rash on forearm noted (unable to appreciate if there are burrows)) present.  Neurological:     Mental Status: She is alert and oriented to person, place, and time. Mental status is at baseline.  Psychiatric:        Mood and Affect: Mood normal.        Behavior: Behavior normal.      No results found for any visits on 01/12/19.     Assessment & Plan    I discussed the assessment and treatment plan with the patient. The patient was  provided an opportunity to ask questions and all were answered. The patient agreed with the plan and demonstrated an understanding of the instructions.   The patient was advised to call back or seek an in-person evaluation if the symptoms worsen or if the condition fails to improve as anticipated.  1. Scabies - patient's rash consistent with scabies and she has known exposure - discussed importance of getting entire household treated and about how to wash and bag up items in her home to eliminate the problem - discussed pregnancy risk factor B for permethrin cream, which will be used to treat - discussed return precautions   Meds ordered this encounter  Medications  . permethrin (ELIMITE) 5 % cream    Sig: Apply 1 application topically once for 1 dose.    Dispense:  60 g    Refill:  0     Return if symptoms worsen or fail to improve.   The entirety of the information documented in the History of Present Illness, Review of Systems and Physical Exam were personally obtained by me. Portions of this information were initially documented by [facility], CMA and reviewed by me for thoroughness and accuracy.    Bacigalupo, Marzella SchleinAngela M, MD MPH [facility]

## 2019-02-07 ENCOUNTER — Encounter: Payer: Self-pay | Admitting: Family Medicine

## 2019-02-07 MED ORDER — CITALOPRAM HYDROBROMIDE 20 MG PO TABS: 20.0000 mg | ORAL_TABLET | Freq: Every day | ORAL | 3 refills | Status: DC

## 2019-02-09 DIAGNOSIS — Z23 Encounter for immunization: Secondary | ICD-10-CM | POA: Diagnosis not present

## 2019-02-09 DIAGNOSIS — Z3A19 19 weeks gestation of pregnancy: Secondary | ICD-10-CM | POA: Diagnosis not present

## 2019-02-09 DIAGNOSIS — Z3400 Encounter for supervision of normal first pregnancy, unspecified trimester: Secondary | ICD-10-CM | POA: Diagnosis not present

## 2019-02-09 DIAGNOSIS — Z361 Encounter for antenatal screening for raised alphafetoprotein level: Secondary | ICD-10-CM | POA: Diagnosis not present

## 2019-02-09 DIAGNOSIS — Z363 Encounter for antenatal screening for malformations: Secondary | ICD-10-CM | POA: Diagnosis not present

## 2019-03-07 DIAGNOSIS — Z3400 Encounter for supervision of normal first pregnancy, unspecified trimester: Secondary | ICD-10-CM | POA: Diagnosis not present

## 2019-03-07 DIAGNOSIS — N760 Acute vaginitis: Secondary | ICD-10-CM | POA: Diagnosis not present

## 2019-03-13 ENCOUNTER — Other Ambulatory Visit: Payer: Self-pay

## 2019-03-13 ENCOUNTER — Inpatient Hospital Stay (HOSPITAL_COMMUNITY)
Admission: AD | Admit: 2019-03-13 | Discharge: 2019-03-13 | Disposition: A | Discharge status: Home / Self Care | Payer: 59 | Attending: Obstetrics and Gynecology | Admitting: Obstetrics and Gynecology

## 2019-03-13 ENCOUNTER — Encounter (HOSPITAL_COMMUNITY): Payer: Self-pay

## 2019-03-13 DIAGNOSIS — F419 Anxiety disorder, unspecified: Secondary | ICD-10-CM | POA: Insufficient documentation

## 2019-03-13 DIAGNOSIS — M549 Dorsalgia, unspecified: Secondary | ICD-10-CM | POA: Diagnosis not present

## 2019-03-13 DIAGNOSIS — M7918 Myalgia, other site: Secondary | ICD-10-CM | POA: Insufficient documentation

## 2019-03-13 DIAGNOSIS — O26892 Other specified pregnancy related conditions, second trimester: Secondary | ICD-10-CM | POA: Diagnosis not present

## 2019-03-13 DIAGNOSIS — O99343 Other mental disorders complicating pregnancy, third trimester: Secondary | ICD-10-CM | POA: Diagnosis not present

## 2019-03-13 DIAGNOSIS — R109 Unspecified abdominal pain: Secondary | ICD-10-CM | POA: Insufficient documentation

## 2019-03-13 DIAGNOSIS — Z87891 Personal history of nicotine dependence: Secondary | ICD-10-CM | POA: Diagnosis not present

## 2019-03-13 DIAGNOSIS — Z79899 Other long term (current) drug therapy: Secondary | ICD-10-CM | POA: Insufficient documentation

## 2019-03-13 DIAGNOSIS — Z3A23 23 weeks gestation of pregnancy: Secondary | ICD-10-CM | POA: Insufficient documentation

## 2019-03-13 LAB — URINALYSIS, ROUTINE W REFLEX MICROSCOPIC
Bilirubin Urine: NEGATIVE
Glucose, UA: NEGATIVE mg/dL
Hgb urine dipstick: NEGATIVE
Ketones, ur: NEGATIVE mg/dL
Leukocytes,Ua: NEGATIVE
Nitrite: NEGATIVE
Protein, ur: NEGATIVE mg/dL
Specific Gravity, Urine: 1.003 — ABNORMAL LOW (ref 1.005–1.030)
pH: 7 (ref 5.0–8.0)

## 2019-03-13 MED ORDER — CYCLOBENZAPRINE HCL 10 MG PO TABS
10.0000 mg | ORAL_TABLET | Freq: Once | ORAL | Status: AC
  Administered 2019-03-13: 10 mg via ORAL
  Filled 2019-03-13: qty 1

## 2019-03-13 MED ORDER — CYCLOBENZAPRINE HCL 10 MG PO TABS: 10.0000 mg | ORAL_TABLET | Freq: Three times a day (TID) | ORAL | 0 refills | Status: DC | PRN

## 2019-03-13 NOTE — Discharge Instructions (Signed)
Keep you appointment in the office this week. Return here if you have worsening symptoms.

## 2019-03-13 NOTE — MAU Note (Signed)
Dawn Martinez is a 28 y.o. at [redacted]w[redacted]d here in MAU reporting: today she has been feeling left side abdominal and back pain. States it is feeling sore and tight. States the pain is constant but the intensity changes. Took 2 extra strength tylenol around 1630 with minimal relief. States she is currently taking flagyl for BV and is on her 3rd day. No bleeding or LOF. +FM  Onset of complaint: today  Pain score: 7/10 when the pain worsens, 4/10 is baseline pain  Vitals:   03/13/19 1747  BP: 122/66  Pulse: 87  Resp: 16  Temp: 98.2 F (36.8 C)  SpO2: 100%     FHT: +FM  Lab orders placed from triage: UA

## 2019-03-13 NOTE — MAU Provider Note (Signed)
History     CSN: [id number]  Arrival date and time: 03/13/19 1730   First Provider Initiated Contact with Patient 03/13/19 1811      Chief Complaint  Patient presents with  . Abdominal Pain  . Back Pain   HPI Dawn Martinez 28 y.o. [redacted]w[redacted]d  Called the on call nurse today and was told to come in for evaluation.  Had a hard time getting comfortable last night and then today could not get comfortable at home.  Having pain in the left side of her back and feels like the left side of her abdomen is tightening up periodically.  Took Tylenol a couple of hours ago and there has been no relief from the pain.  Client of [facility].  OB History    Gravida  1   Para  0   Term  0   Preterm  0   AB  0   Living  0     SAB  0   TAB  0   Ectopic  0   Multiple  0   Live Births              Past Medical History:  Diagnosis Date  . ADHD (attention deficit hyperactivity disorder)   . Anxiety     Past Surgical History:  Procedure Laterality Date  . APPENDECTOMY      Family History  Problem Relation Age of Onset  . Depression Mother   . Anxiety disorder Mother   . Thyroid disease Mother   . ADD / ADHD Brother   . Gallbladder disease Sister   . Endometriosis Sister   . Heart disease Father   . Diabetes Father   . Hypertension Father   . Congestive Heart Failure Father   . Prostate cancer Father   . Leukemia Father     Social History   Tobacco Use  . Smoking status: Former Smoker    Quit date: 11/18/2014    Years since quitting: 4.3  . Smokeless tobacco: Never Used  Substance Use Topics  . Alcohol use: No    Alcohol/week: 0.0 standard drinks    Comment: occasional  . Drug use: No    Allergies:  Allergies  Allergen Reactions  . Cefaclor     hives  . Duloxetine     Nausea and G.I. upset  . Promethazine Hcl     hallucinations    Medications Prior to Admission  Medication Sig Dispense Refill Last Dose  . citalopram (CELEXA) 20 MG  tablet Take 1 tablet (20 mg total) by mouth daily. 30 tablet 3   . docusate sodium (COLACE) 100 MG capsule Take 200 mg by mouth daily as needed for mild constipation.     . fluticasone (FLONASE) 50 MCG/ACT nasal spray Place 2 sprays into both nostrils daily. (Patient not taking: Reported on 12/10/2018) 16 g 5   . ibuprofen (ADVIL,MOTRIN) 200 MG tablet Take 400-800 mg by mouth every 6 (six) hours as needed for headache or moderate pain.     . methylphenidate (RITALIN) 5 MG tablet Take 1 tablet (5 mg total) by mouth 2 (two) times daily with breakfast and lunch. (Patient not taking: Reported on 12/10/2018) 60 tablet 0   . methylphenidate (RITALIN) 5 MG tablet Take 1 tablet (5 mg total) by mouth 2 (two) times daily with breakfast and lunch. (Patient not taking: Reported on 11/10/2018) 60 tablet 0   . ondansetron (ZOFRAN) 4 MG tablet Take 1 tablet (4 mg  total) by mouth every 8 (eight) hours as needed for nausea or vomiting. 30 tablet 0     Review of Systems  Constitutional: Negative for fever.  Respiratory: Negative for cough and shortness of breath.   Gastrointestinal: Positive for abdominal pain. Negative for constipation, nausea and vomiting.  Genitourinary: Negative for dysuria, vaginal bleeding and vaginal discharge.  Musculoskeletal: Positive for back pain.   Physical Exam   Blood pressure 122/66, pulse 87, temperature 98.2 F (36.8 C), temperature source Oral, resp. rate 16, height 5' (1.524 m), weight 78 kg, last menstrual period 10/02/2018, SpO2 100 %.  Physical Exam  Nursing note and vitals reviewed. Constitutional: She is oriented to person, place, and time. She appears well-developed and well-nourished.  HENT:  Head: Normocephalic.  Eyes: EOM are normal.  Neck: Neck supple.  GI: Soft. There is no abdominal tenderness. There is no rebound and no guarding.  No contractions palpated by provider.   FHT baseline 140 with moderate variability.  No decelerations.  No contractions.  10x10  accels noted. NST reassuring for gestational age.  Musculoskeletal: Normal range of motion.  Neurological: She is alert and oriented to person, place, and time.  Skin: Skin is warm and dry.  Psychiatric: She has a normal mood and affect.    MAU Course  Procedures Labs Results for orders placed or performed during the hospital encounter of 03/13/19 (from the past 24 hour(s))  Urinalysis, Routine w reflex microscopic     Status: Abnormal   Collection Time: 03/13/19  6:18 PM  Result Value Ref Range   Color, Urine STRAW (A) YELLOW   APPearance CLEAR CLEAR   Specific Gravity, Urine 1.003 (Martinez) 1.005 - 1.030   pH 7.0 5.0 - 8.0   Glucose, UA NEGATIVE NEGATIVE mg/dL   Hgb urine dipstick NEGATIVE NEGATIVE   Bilirubin Urine NEGATIVE NEGATIVE   Ketones, ur NEGATIVE NEGATIVE mg/dL   Protein, ur NEGATIVE NEGATIVE mg/dL   Nitrite NEGATIVE NEGATIVE   Leukocytes,Ua NEGATIVE NEGATIVE    MDM On talking about her activities, has been doing lots of cleaning and clearing out.  Has been sweeping, mopping and vacuuming - lots on Saturday.   Cleaning out one house to put up for sale.  Has not been drinking enough fluids.  Will look at urine for blood in case of a kidney stone but most likely pain is musculoskeletal from cleaning yesterday.  Will give one dose of Flexeril and use an ice pack in MAU. No evidence of blood in urine. Client states abdominal pain has been relieved and back pain is somewhat less but not fully relieved.  Will send prescription to 24 hour pharmacy so she can get her medication tonight.  Assessment and Plan  Musculoskeletal pain at [redacted]w[redacted]d  Plan Will send Flexeril to [place] so it is available to her tonight. Take a second dose late tonight or if you awaken in pain after midnight. Use the ice pack BID to your back. Consider a pregnancy support belt for work. Keep you appointment in the office this week. Return here if you have worsening symptoms.  Dawn Martinez  Dawn Martinez 03/13/2019, 6:28 PM

## 2019-03-18 ENCOUNTER — Encounter: Payer: Self-pay | Admitting: Family Medicine

## 2019-03-21 MED ORDER — CITALOPRAM HYDROBROMIDE 20 MG PO TABS: 20.0000 mg | ORAL_TABLET | Freq: Every day | ORAL | 3 refills | Status: DC

## 2019-04-04 DIAGNOSIS — Z3480 Encounter for supervision of other normal pregnancy, unspecified trimester: Secondary | ICD-10-CM | POA: Diagnosis not present

## 2019-04-04 DIAGNOSIS — Z3402 Encounter for supervision of normal first pregnancy, second trimester: Secondary | ICD-10-CM | POA: Diagnosis not present

## 2019-04-04 DIAGNOSIS — Z23 Encounter for immunization: Secondary | ICD-10-CM | POA: Diagnosis not present

## 2019-04-26 ENCOUNTER — Encounter: Payer: Self-pay | Admitting: Family Medicine

## 2019-04-26 ENCOUNTER — Other Ambulatory Visit: Payer: Self-pay

## 2019-04-27 MED ORDER — CITALOPRAM HYDROBROMIDE 20 MG PO TABS: 20.0000 mg | ORAL_TABLET | Freq: Every day | ORAL | 3 refills | Status: DC

## 2019-05-15 ENCOUNTER — Other Ambulatory Visit: Payer: Self-pay

## 2019-05-15 ENCOUNTER — Emergency Department (HOSPITAL_COMMUNITY)
Admission: EM | Admit: 2019-05-15 | Discharge: 2019-05-15 | Disposition: A | Discharge status: Home / Self Care | Payer: 59 | Attending: Emergency Medicine | Admitting: Emergency Medicine

## 2019-05-15 ENCOUNTER — Emergency Department (HOSPITAL_COMMUNITY): Payer: 59

## 2019-05-15 DIAGNOSIS — Z3A32 32 weeks gestation of pregnancy: Secondary | ICD-10-CM | POA: Insufficient documentation

## 2019-05-15 DIAGNOSIS — R0602 Shortness of breath: Secondary | ICD-10-CM | POA: Diagnosis not present

## 2019-05-15 DIAGNOSIS — Z87891 Personal history of nicotine dependence: Secondary | ICD-10-CM | POA: Insufficient documentation

## 2019-05-15 DIAGNOSIS — O98513 Other viral diseases complicating pregnancy, third trimester: Secondary | ICD-10-CM | POA: Insufficient documentation

## 2019-05-15 DIAGNOSIS — R1030 Lower abdominal pain, unspecified: Secondary | ICD-10-CM | POA: Diagnosis not present

## 2019-05-15 DIAGNOSIS — Z79899 Other long term (current) drug therapy: Secondary | ICD-10-CM | POA: Diagnosis not present

## 2019-05-15 DIAGNOSIS — U071 COVID-19: Secondary | ICD-10-CM | POA: Insufficient documentation

## 2019-05-15 LAB — CBC WITH DIFFERENTIAL/PLATELET
Abs Immature Granulocytes: 0.11 10*3/uL — ABNORMAL HIGH (ref 0.00–0.07)
Basophils Absolute: 0 10*3/uL (ref 0.0–0.1)
Basophils Relative: 0 %
Eosinophils Absolute: 0 10*3/uL (ref 0.0–0.5)
Eosinophils Relative: 0 %
HCT: 32.1 % — ABNORMAL LOW (ref 36.0–46.0)
Hemoglobin: 10.5 g/dL — ABNORMAL LOW (ref 12.0–15.0)
Immature Granulocytes: 1 %
Lymphocytes Relative: 24 %
Lymphs Abs: 1.9 10*3/uL (ref 0.7–4.0)
MCH: 29.7 pg (ref 26.0–34.0)
MCHC: 32.7 g/dL (ref 30.0–36.0)
MCV: 90.9 fL (ref 80.0–100.0)
Monocytes Absolute: 0.7 10*3/uL (ref 0.1–1.0)
Monocytes Relative: 9 %
Neutro Abs: 5.1 10*3/uL (ref 1.7–7.7)
Neutrophils Relative %: 66 %
Platelets: 223 10*3/uL (ref 150–400)
RBC: 3.53 MIL/uL — ABNORMAL LOW (ref 3.87–5.11)
RDW: 13.7 % (ref 11.5–15.5)
WBC: 7.8 10*3/uL (ref 4.0–10.5)
nRBC: 0 % (ref 0.0–0.2)

## 2019-05-15 LAB — COMPREHENSIVE METABOLIC PANEL
ALT: 33 U/L (ref 0–44)
AST: 31 U/L (ref 15–41)
Albumin: 2.6 g/dL — ABNORMAL LOW (ref 3.5–5.0)
Alkaline Phosphatase: 90 U/L (ref 38–126)
Anion gap: 7 (ref 5–15)
BUN: <5 mg/dL — ABNORMAL LOW (ref 6–20)
CO2: 21 mmol/L — ABNORMAL LOW (ref 22–32)
Calcium: 8.7 mg/dL — ABNORMAL LOW (ref 8.9–10.3)
Chloride: 106 mmol/L (ref 98–111)
Creatinine, Ser: 0.41 mg/dL — ABNORMAL LOW (ref 0.44–1.00)
GFR calc Af Amer: >60 mL/min (ref >60)
GFR calc non Af Amer: >60 mL/min (ref >60)
Glucose, Bld: 95 mg/dL (ref 70–99)
Potassium: 3.3 mmol/L — ABNORMAL LOW (ref 3.5–5.1)
Sodium: 134 mmol/L — ABNORMAL LOW (ref 135–145)
Total Bilirubin: 0.3 mg/dL (ref 0.3–1.2)
Total Protein: 5.7 g/dL — ABNORMAL LOW (ref 6.5–8.1)

## 2019-05-15 LAB — I-STAT BETA HCG BLOOD, ED (MC, WL, AP ONLY): I-stat hCG, quantitative: >2000 m[IU]/mL — ABNORMAL HIGH (ref <5)

## 2019-05-15 LAB — POC OCCULT BLOOD, ED: Fecal Occult Bld: NEGATIVE

## 2019-05-15 LAB — FERRITIN: Ferritin: 13 ng/mL (ref 11–307)

## 2019-05-15 LAB — LACTATE DEHYDROGENASE: LDH: 134 U/L (ref 98–192)

## 2019-05-15 LAB — LACTIC ACID, PLASMA: Lactic Acid, Venous: 0.8 mmol/L (ref 0.5–1.9)

## 2019-05-15 LAB — FIBRINOGEN: Fibrinogen: 449 mg/dL (ref 210–475)

## 2019-05-15 LAB — PROCALCITONIN: Procalcitonin: <0.1 ng/mL

## 2019-05-15 LAB — D-DIMER, QUANTITATIVE: D-Dimer, Quant: 1 ug/mL-FEU — ABNORMAL HIGH (ref 0.00–0.50)

## 2019-05-15 LAB — C-REACTIVE PROTEIN: CRP: 2 mg/dL — ABNORMAL HIGH (ref <1.0)

## 2019-05-15 LAB — LIPASE, BLOOD: Lipase: 31 U/L (ref 11–51)

## 2019-05-15 LAB — TRIGLYCERIDES: Triglycerides: 264 mg/dL — ABNORMAL HIGH (ref <150)

## 2019-05-15 NOTE — ED Notes (Signed)
OB rapid, Corrie Dandy, was called. She is currently monitoring another pt and will be down when she is done, stated maybe 20 minutes or so.

## 2019-05-15 NOTE — Progress Notes (Signed)
Spoke with Dr. Langston Masker. Pt is a G1P0 at 32 4/[redacted] weeks gestation presenting with worsening covid symptoms. Pt says she tested positive for covid on Dec 28th. Pt is complaing of recurrent fevers, sweats, chills, cough, and shortness of breath. No vaginal bleeding or leaking of fluid. FHR baseline is 130 BPM, moderate variability, accels, no decels. One uc noted. Pt is OB cleared. She can take Robitussin and musinex as needed She is to drink plenty of fluids.

## 2019-05-15 NOTE — ED Notes (Signed)
Patient requesting to not have Covid swab completed. Reports positive test through health at work last week. MD notified.

## 2019-05-15 NOTE — ED Triage Notes (Signed)
Pt states she was COVID + on 12/29.  32 weeks 4 days pregnant.  C/o SOB and abd pain.

## 2019-05-15 NOTE — Discharge Instructions (Signed)
As we discussed your testing is reassuring.  You declined CT scan today to evaluate for blood clots.  We do not suspect a blood clot though because her heart rate and oxygen level are normal.  Keep yourself hydrated.  Use Tylenol and follow-up with your primary doctor as well as your OB doctor.  Keep yourself quarantined for 2 weeks from your last positive coronavirus test.  Return to the ED if you develop chest pain, shortness of breath or other concerns.

## 2019-05-15 NOTE — ED Notes (Signed)
Patient ambulated in room, Sp02 remained 97-100% on RA, denies more shortness of breathe than usual while ambulating,

## 2019-05-15 NOTE — Progress Notes (Addendum)
Pt presents with worsening covid symptoms. Pt says she tested positive for covid on Dec 28th. Pt says she is still having fevers, chills, and sweats, cough, and shortness of breath. She is a pt of [facility]. Pt denies vaginal bleeding or leaking of fluid.

## 2019-05-15 NOTE — ED Provider Notes (Signed)
[facility] EMERGENCY DEPARTMENT Provider Note   CSN: [id number] Arrival date & time: 05/15/19  1447     History Chief Complaint  Patient presents with  . COVID +  . Pregnant  . Shortness of Breath  . Abdominal Pain    Dawn Martinez is a 29 y.o. female.  Patient is G1, P0 at [redacted] weeks gestation with her first pregnancy.  States she has been feeling fatigued, having chills, coughing, shortness of breath, body aches and headache since approximately December 29.  States the headache positive Covid test at that time.  Comes in today feeling increased weakness, fatigue, intermittent vomiting.  States she vomited one time today and one time yesterday.  No diarrhea.  Cough is nonproductive.  She has had chills but no documented fever.  She has pain with inspiration and some shortness of breath and dry coughing.  Does not feel short of breath resting.  She has had lower abdominal cramping on the left side of her abdomen for several weeks.  She was told by her OB that everything was okay but this pain persists.  She also has intermittent sharp pain to her left lower quadrant and last for a few seconds at a time that is becoming more frequent.  Denies any vaginal bleeding or discharge.  No pain with urination or blood in the urine.  Has had sick contacts at work.  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 Denies any history of blood clots.  The history is provided by the patient.  Shortness of Breath Associated symptoms: abdominal pain, chest pain, cough, fever, headaches and vomiting   Associated symptoms: no rash   Abdominal Pain Associated symptoms: chest pain, chills, cough, fatigue, fever, nausea, shortness of breath and vomiting   Associated symptoms: no dysuria and no hematuria        Past Medical History:  Diagnosis Date  . ADHD (attention deficit hyperactivity disorder)   . Anxiety     Patient Active Problem List   Diagnosis Date Noted  . IBS (irritable  bowel syndrome) 02/02/2017  . ADD (attention deficit disorder) 02/14/2015  . ASCUS favoring benign 11/03/2014  . GAD (generalized anxiety disorder) 11/03/2014  . MDD (major depressive disorder) 11/03/2014  . 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 Allergic rhinitis, seasonal 11/03/2014    Past Surgical History:  Procedure Laterality Date  . APPENDECTOMY       OB History    Gravida  1   Para  0   Term  0   Preterm  0   AB  0   Living  0     SAB  0   TAB  0   Ectopic  0   Multiple  0   Live Births              Family History  Problem Relation Age of Onset  . Depression Mother   . Anxiety disorder Mother   . Thyroid disease Mother   . ADD / ADHD Brother   . Gallbladder disease Sister   . Endometriosis Sister   . Heart disease Father   . Diabetes Father   . Hypertension Father   . Congestive Heart Failure Father   . Prostate cancer Father   . Leukemia Father     Social History   Tobacco Use  . Smoking status: Former Smoker    Quit date: 11/18/2014    Years since quitting: 4.4  . Smokeless  tobacco: Never Used  Substance Use Topics  . Alcohol use: No    Alcohol/week: 0.0 standard drinks    Comment: occasional  . Drug use: No    Home Medications Prior to Admission medications   Medication Sig Start Date End Date Taking? Authorizing Provider  citalopram (CELEXA) 20 MG tablet Take 1 tablet (20 mg total) by mouth daily. Patient taking differently: Take 20 mg by mouth at bedtime.  04/27/19  Yes Bacigalupo, Marzella Schlein, MD  cyclobenzaprine (FLEXERIL) 10 MG tablet Take 1 tablet (10 mg total) by mouth 3 (three) times daily as needed for muscle spasms. 03/13/19  Yes Burleson, Terri L, NP  ondansetron (ZOFRAN) 4 MG tablet Take 1 tablet (4 mg total) by mouth every 8 (eight) hours as needed for nausea or vomiting. 12/14/18  Yes Bacigalupo, Marzella Schlein, MD  fluticasone (FLONASE) 50 MCG/ACT nasal spray Place 2 sprays into both nostrils  daily. Patient not taking: Reported on 12/10/2018 02/25/16   Anola Gurney, PA    Allergies    Cefaclor, Duloxetine, and Promethazine hcl  Review of Systems   Review of Systems  Constitutional: Positive for activity change, appetite change, chills, fatigue and fever.  HENT: Positive for congestion. Negative for rhinorrhea.   Eyes: Negative for visual disturbance.  Respiratory: Positive for cough, chest tightness and shortness of breath.   Cardiovascular: Positive for chest pain.  Gastrointestinal: Positive for abdominal pain, nausea and vomiting.  Genitourinary: Negative for dysuria and hematuria.  Musculoskeletal: Positive for arthralgias and myalgias.  Skin: Negative for rash.  Neurological: Positive for weakness and headaches. Negative for dizziness and light-headedness.   all other systems are negative except as noted in the HPI and PMH.    Physical Exam Updated Vital Signs BP 115/73 (BP Location: Left Arm)   Pulse 100   Temp 98 F (36.7 C) (Oral)   Resp 20   LMP 10/02/2018   SpO2 100%   Physical Exam Vitals and nursing note reviewed.  Constitutional:      General: She is not in acute distress.    Appearance: She is well-developed. She is not ill-appearing.     Comments: Speaking in full sentences, no distress  HENT:     Head: Normocephalic and atraumatic.     Mouth/Throat:     Pharynx: No oropharyngeal exudate.  Eyes:     Conjunctiva/sclera: Conjunctivae normal.     Pupils: Pupils are equal, round, and reactive to light.  Neck:     Comments: No meningismus. Cardiovascular:     Rate and Rhythm: Normal rate and regular rhythm.     Heart sounds: Normal heart sounds. No murmur.  Pulmonary:     Effort: Pulmonary effort is normal. No respiratory distress.     Breath sounds: Normal breath sounds. No wheezing.  Abdominal:     Palpations: Abdomen is soft.     Tenderness: There is no abdominal tenderness. There is no guarding or rebound.     Comments: Gravid  abdomen, mild left lower quadrant tenderness, no guarding or rebound  Musculoskeletal:        General: No tenderness. Normal range of motion.     Cervical back: Normal range of motion and neck supple.  Skin:    General: Skin is warm.     Capillary Refill: Capillary refill takes less than 2 seconds.  Neurological:     General: No focal deficit present.     Mental Status: She is alert and oriented to person, place, and time. Mental status  is at baseline.     Cranial Nerves: No cranial nerve deficit.     Motor: No abnormal muscle tone.     Coordination: Coordination normal.     Comments:  5/5 strength throughout. CN 2-12 intact.Equal grip strength.   Psychiatric:        Behavior: Behavior normal.     ED Results / Procedures / Treatments   Labs (all labs ordered are listed, but only abnormal results are displayed) Labs Reviewed  CBC WITH DIFFERENTIAL/PLATELET - Abnormal; Notable for the following components:      Result Value   RBC 3.53 (*)    Hemoglobin 10.5 (*)    HCT 32.1 (*)    Abs Immature Granulocytes 0.11 (*)    All other components within normal limits  COMPREHENSIVE METABOLIC PANEL - Abnormal; Notable for the following components:   Sodium 134 (*)    Potassium 3.3 (*)    CO2 21 (*)    BUN <5 (*)    Creatinine, Ser 0.41 (*)    Calcium 8.7 (*)    Total Protein 5.7 (*)    Albumin 2.6 (*)    All other components within normal limits  D-DIMER, QUANTITATIVE (NOT AT [facility]) - Abnormal; Notable for the following components:   D-Dimer, Quant 1.00 (*)    All other components within normal limits  C-REACTIVE PROTEIN - Abnormal; Notable for the following components:   CRP 2.0 (*)    All other components within normal limits  TRIGLYCERIDES - Abnormal; Notable for the following components:   Triglycerides 264 (*)    All other components within normal limits  I-STAT BETA HCG BLOOD, ED ([facility], [facility], [facility] ONLY) - Abnormal; Notable for the following components:   I-stat hCG, quantitative  >2,000.0 (*)    All other components within normal limits  CULTURE, BLOOD (ROUTINE X 2)  CULTURE, BLOOD (ROUTINE X 2)  LACTIC ACID, PLASMA  PROCALCITONIN  LACTATE DEHYDROGENASE  FERRITIN  FIBRINOGEN  LIPASE, BLOOD  POC OCCULT BLOOD, ED  POC SARS CORONAVIRUS 2 AG -  ED    EKG EKG Interpretation  Date/Time:  Sunday May 15 2019 17:00:32 EST Ventricular Rate:  96 PR Interval:    QRS Duration: 91 QT Interval:  357 QTC Calculation: 452 R Axis:   69 Text Interpretation: Sinus rhythm Borderline Q waves in inferior leads Minimal ST depression, inferior leads No significant change was found Confirmed by Glynn Octave [phone]) on 05/15/2019 5:45:19 PM   Radiology DG Chest Port 1 View  Result Date: 05/15/2019 CLINICAL DATA:  Shortness of breath, COVID positive EXAM: PORTABLE CHEST 1 VIEW COMPARISON:  None. FINDINGS: The heart size and mediastinal contours are within normal limits. Subtle heterogeneous airspace opacities of the bilateral lung bases. The visualized skeletal structures are unremarkable. IMPRESSION: Subtle heterogeneous airspace opacities of the bilateral lung bases, consistent with reported COVID-19. Electronically Signed   By: Lauralyn Primes M.D.   On: 05/15/2019 17:52    Procedures Procedures (including critical care time)  Medications Ordered in ED Medications - No data to display  ED Course  I have reviewed the triage vital signs and the nursing notes.  Pertinent labs & imaging results that were available during my care of the patient were reviewed by me and considered in my medical decision making (see chart for details).    MDM Rules/Calculators/A&P                      Patient [redacted] weeks pregnant with  5 days of cough, congestion, body aches, chills and some pleuritic shortness of breath or chest pain.  She is not tachycardic or hypoxic.  Patient placed on fetal monitor.  Seen by rapid OB nurse. Her chest x-ray shows mild bibasilar atelectasis.  Denies any  chest pain except when she takes a breath in.  She is not hypoxic or tachycardic.  Labs are reassuring.  Mild drop in hemoglobin to 10 from 13, 2 years ago.  Denies any recent dark or bloody stools.  She declines Hemoccult exam.  Denies any vaginal bleeding. D-dimer slightly elevated at 1.0.  This is not unexpected with her history of pregnancy and Covid. Possibility of pulmonary embolism discussed with patient.  She agrees without tachycardia or hypoxia does not want to pursue imaging.  Patient has been monitored by OB and cleared obstetrically.  OB nurse discussed with Dr. Lynnette Caffey.  Patient did agree to Hemoccult which is negative.  She is able to ambulate in the room without desaturation or hypoxia.  She denies any shortness of breath.  She is tolerating p.o. and ambulatory.  She has been cleared obstetrically. Risks and benefits of CT scan discussed with patient.  She agrees does not want to proceed with CT imaging. She is in no respiratory distress and has no hypoxia or tachycardia.  Denies any leg pain or leg swelling.  D-dimer only mildly elevated.  D-dimer likely elevated in setting of Covid as well as pregnancy.  Patient tolerating p.o.  Discussed oral hydration at home, antipyretics antitussives.  Follow-up with her OB.  Return precautions discussed.  Clarita K Dunavant was evaluated in Emergency Department on 05/15/2019 for the symptoms described in the history of present illness. She was evaluated in the context of the global COVID-19 pandemic, which necessitated consideration that the patient might be at risk for infection with the SARS-CoV-2 virus that causes COVID-19. Institutional protocols and algorithms that pertain to the evaluation of patients at risk for COVID-19 are in a state of rapid change based on information released by regulatory bodies including the CDC and federal and state organizations. These policies and algorithms were followed during the patient's care in the  ED.  Final Clinical Impression(s) / ED Diagnoses Final diagnoses:  COVID-19 virus infection    Rx / DC Orders ED Discharge Orders    None       Allsion Nogales, Annie Main, MD 05/16/19 0015

## 2019-05-15 NOTE — ED Notes (Signed)
[facility] at bedside 

## 2019-05-17 ENCOUNTER — Ambulatory Visit: Payer: Self-pay

## 2019-05-17 ENCOUNTER — Other Ambulatory Visit: Payer: Self-pay

## 2019-05-17 ENCOUNTER — Emergency Department: Payer: 59

## 2019-05-17 ENCOUNTER — Encounter: Payer: Self-pay | Admitting: Emergency Medicine

## 2019-05-17 ENCOUNTER — Emergency Department
Admission: EM | Admit: 2019-05-17 | Discharge: 2019-05-17 | Disposition: A | Discharge status: Home / Self Care | Payer: 59 | Attending: Student | Admitting: Student

## 2019-05-17 DIAGNOSIS — R0600 Dyspnea, unspecified: Secondary | ICD-10-CM | POA: Diagnosis not present

## 2019-05-17 DIAGNOSIS — Z79899 Other long term (current) drug therapy: Secondary | ICD-10-CM | POA: Insufficient documentation

## 2019-05-17 DIAGNOSIS — R0602 Shortness of breath: Secondary | ICD-10-CM | POA: Diagnosis not present

## 2019-05-17 DIAGNOSIS — U071 COVID-19: Secondary | ICD-10-CM | POA: Diagnosis not present

## 2019-05-17 DIAGNOSIS — O99413 Diseases of the circulatory system complicating pregnancy, third trimester: Secondary | ICD-10-CM | POA: Diagnosis not present

## 2019-05-17 DIAGNOSIS — R0781 Pleurodynia: Secondary | ICD-10-CM | POA: Diagnosis not present

## 2019-05-17 DIAGNOSIS — O98513 Other viral diseases complicating pregnancy, third trimester: Secondary | ICD-10-CM | POA: Diagnosis not present

## 2019-05-17 DIAGNOSIS — Z87891 Personal history of nicotine dependence: Secondary | ICD-10-CM | POA: Diagnosis not present

## 2019-05-17 DIAGNOSIS — Z3A33 33 weeks gestation of pregnancy: Secondary | ICD-10-CM | POA: Diagnosis not present

## 2019-05-17 DIAGNOSIS — R0609 Other forms of dyspnea: Secondary | ICD-10-CM

## 2019-05-17 LAB — CBC
HCT: 30.4 % — ABNORMAL LOW (ref 36.0–46.0)
Hemoglobin: 9.9 g/dL — ABNORMAL LOW (ref 12.0–15.0)
MCH: 29.3 pg (ref 26.0–34.0)
MCHC: 32.6 g/dL (ref 30.0–36.0)
MCV: 89.9 fL (ref 80.0–100.0)
Platelets: 210 10*3/uL (ref 150–400)
RBC: 3.38 MIL/uL — ABNORMAL LOW (ref 3.87–5.11)
RDW: 13.6 % (ref 11.5–15.5)
WBC: 6.6 10*3/uL (ref 4.0–10.5)
nRBC: 0 % (ref 0.0–0.2)

## 2019-05-17 LAB — BASIC METABOLIC PANEL
Anion gap: 10 (ref 5–15)
BUN: 5 mg/dL — ABNORMAL LOW (ref 6–20)
CO2: 22 mmol/L (ref 22–32)
Calcium: 8.8 mg/dL — ABNORMAL LOW (ref 8.9–10.3)
Chloride: 104 mmol/L (ref 98–111)
Creatinine, Ser: 0.48 mg/dL (ref 0.44–1.00)
GFR calc Af Amer: >60 mL/min (ref >60)
GFR calc non Af Amer: >60 mL/min (ref >60)
Glucose, Bld: 117 mg/dL — ABNORMAL HIGH (ref 70–99)
Potassium: 3.1 mmol/L — ABNORMAL LOW (ref 3.5–5.1)
Sodium: 136 mmol/L (ref 135–145)

## 2019-05-17 MED ORDER — ALBUTEROL SULFATE HFA 108 (90 BASE) MCG/ACT IN AERS: 2.0000 | INHALATION_SPRAY | Freq: Two times a day (BID) | RESPIRATORY_TRACT | 0 refills | Status: DC | PRN

## 2019-05-17 MED ORDER — SODIUM CHLORIDE 0.9 % IV BOLUS
1000.0000 mL | Freq: Once | INTRAVENOUS | Status: AC
  Administered 2019-05-17: 1000 mL via INTRAVENOUS

## 2019-05-17 MED ORDER — DEXAMETHASONE SODIUM PHOSPHATE 10 MG/ML IJ SOLN
6.0000 mg | Freq: Once | INTRAMUSCULAR | Status: AC
  Administered 2019-05-17: 6 mg via INTRAVENOUS
  Filled 2019-05-17: qty 1

## 2019-05-17 MED ORDER — IOHEXOL 350 MG/ML SOLN
75.0000 mL | Freq: Once | INTRAVENOUS | Status: AC | PRN
  Administered 2019-05-17: 75 mL via INTRAVENOUS

## 2019-05-17 NOTE — Telephone Encounter (Signed)
Incoming call from Patient who is 32 6/7 weeks. Pregnant.  Positive covid.  Complains of SOB .Marland Kitchen  Out of breath walking from room to room.  Worst feeling.  Being out of breath walking from room to room.  Sx.  Come and go.  Rates Sx. As mild unless at night.  Denies cardiac and lung  Sx.  Patient would like a RX for inhaler and a return call back   to [phone].               Reason for Disposition . [1] MILD longstanding difficulty breathing AND [2]  SAME as normal  Answer Assessment - Initial Assessment Questions 1. RESPIRATORY STATUS: "Describe your breathing?" (e.g., wheezing, shortness of breath, unable to speak, severe coughing)     SOB.   2. ONSET: "When did this breathing problem begin?"      29 th covid results 3. PATTERN "Does the difficult breathing come and go, or has it been constant since it started?"     Come and go.   4. SEVERITY: "How bad is your breathing?" (e.g., mild, moderate, severe)    - MILD: No SOB at rest, mild SOB with walking, speaks normally in sentences, can lay down, no retractions, pulse < 100.    - MODERATE: SOB at rest, SOB with minimal exertion and prefers to sit, cannot lie down flat, speaks in phrases, mild retractions, audible wheezing, pulse 100-120.    - SEVERE: Very SOB at rest, speaks in single words, struggling to breathe, sitting hunched forward, retractions, pulse > 120    Mild unless at night.   5. RECURRENT SYMPTOM: "Have you had difficulty breathing before?" If so, ask: "When was the last time?" and "What happened that time?"    yes 6. CARDIAC HISTORY: "Do you have any history of heart disease?" (e.g., heart attack, angina, bypass surgery, angioplasty)     denies 7. LUNG HISTORY: "Do you have any history of lung disease?"  (e.g., pulmonary embolus, asthma, emphysema)    Denies  8. CAUSE: "What do you think is causing the breathing problem?"      *No Answer* 9. OTHER SYMPTOMS: "Do you have any other symptoms? (e.g., dizziness, runny  nose, cough, chest pain, fever)     10. PREGNANCY: "Is there any chance you are pregnant?" "When was your last menstrual period?"      Currently  32 6/7  11. TRAVEL: "Have you traveled out of the country in the last month?" (e.g., travel history, exposures)     Denies.  Protocols used: BREATHING DIFFICULTY-A-AH

## 2019-05-17 NOTE — ED Provider Notes (Signed)
[facility] Emergency Department Provider Note  ____________________________________________   First MD Initiated Contact with Patient 05/17/19 1530     (approximate)  I have reviewed the triage vital signs and the nursing notes.  History  Chief Complaint Shortness of Breath    HPI Dawn Martinez is a 29 y.o. female G1P0 at approximately 33 weeks, COVID positive on 12/29 who presents for SOB, DOE, and pleuritic chest pain. Patient states symptoms have been worsening since onset. SOB worsens with exertion. Chest pain is sharp and central, and present with deep inspiration. No radiation. Moderate in severity. Discussed with her PCP and OB who recommended evaluation in the ED to rule out PE. Patient denies any hemoptysis, leg swelling, or hx of VTE. Former smoker, quite about 4 years ago.  Denies any abdominal pain or contractions, no leakage of fluid or vaginal bleeeding.    Past Medical Hx Past Medical History:  Diagnosis Date  . ADHD (attention deficit hyperactivity disorder)   . Anxiety     Problem List Patient Active Problem List   Diagnosis Date Noted  . IBS (irritable bowel syndrome) 02/02/2017  . ADD (attention deficit disorder) 02/14/2015  . ASCUS favoring benign 11/03/2014  . GAD (generalized anxiety disorder) 11/03/2014  . MDD (major depressive disorder) 11/03/2014  . 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 Allergic rhinitis, seasonal 11/03/2014    Past Surgical Hx Past Surgical History:  Procedure Laterality Date  . APPENDECTOMY      Medications Prior to Admission medications   Medication Sig Start Date End Date Taking? Authorizing Provider  acetaminophen (TYLENOL) 500 MG tablet Take 1,000 mg by mouth 2 (two) times daily.    [provider]  calcium carbonate (TUMS - DOSED IN MG ELEMENTAL CALCIUM) 500 MG chewable tablet Chew 2 tablets by mouth daily.    [provider]  citalopram (CELEXA)  20 MG tablet Take 1 tablet (20 mg total) by mouth daily. Patient taking differently: Take 20 mg by mouth at bedtime.  04/27/19   Bacigalupo, Dionne Bucy, MD  cyclobenzaprine (FLEXERIL) 10 MG tablet Take 1 tablet (10 mg total) by mouth 3 (three) times daily as needed for muscle spasms. 03/13/19   Burleson, Rona Ravens, NP  fluticasone (FLONASE) 50 MCG/ACT nasal spray Place 2 sprays into both nostrils daily. Patient not taking: Reported on 12/10/2018 02/25/16   Carmon Ginsberg, PA  ondansetron (ZOFRAN) 4 MG tablet Take 1 tablet (4 mg total) by mouth every 8 (eight) hours as needed for nausea or vomiting. 12/14/18   Virginia Crews, MD  Phenylephrine-Acetaminophen (SUDAFED PE PRESSURE + PAIN PO) Take 1 tablet by mouth daily.    [provider]  Prenatal Vit-Fe Fumarate-FA (PRENATAL PO) Take 2 tablets by mouth daily. gummies    [provider]    Allergies Cefaclor, Duloxetine, and Promethazine hcl  Family Hx Family History  Problem Relation Age of Onset  . Depression Mother   . Anxiety disorder Mother   . Thyroid disease Mother   . ADD / ADHD Brother   . Gallbladder disease Sister   . Endometriosis Sister   . Heart disease Father   . Diabetes Father   . Hypertension Father   . Congestive Heart Failure Father   . Prostate cancer Father   . Leukemia Father     Social Hx Social History   Tobacco Use  . Smoking status: Former Smoker    Quit date: 11/18/2014    Years since quitting:  4.4  . Smokeless tobacco: Never Used  Substance Use Topics  . Alcohol use: No    Alcohol/week: 0.0 standard drinks    Comment: occasional  . Drug use: No     Review of Systems  Constitutional: Negative for fever, chills. Eyes: Negative for visual changes. ENT: Negative for sore throat. Cardiovascular: + for chest pain. Respiratory: + for shortness of breath. Gastrointestinal: Negative for nausea, vomiting.  Genitourinary: Negative for dysuria. Musculoskeletal: Negative for leg  swelling. Skin: Negative for rash. Neurological: Negative for for headaches.   Physical Exam  Vital Signs: ED Triage Vitals  Enc Vitals Group     BP 05/17/19 1506 (!) 99/58     Pulse Rate 05/17/19 1506 87     Resp 05/17/19 1506 18     Temp 05/17/19 1506 97.9 F (36.6 C)     Temp Source 05/17/19 1506 Oral     SpO2 05/17/19 1506 100 %     Weight 05/17/19 1508 180 lb (81.6 kg)     Height 05/17/19 1508 5' (1.524 m)     Head Circumference --      Peak Flow --      Pain Score 05/17/19 1507 8     Pain Loc --      Pain Edu? --      Excl. in GC? --     Constitutional: Alert and oriented.  Head: Normocephalic. Atraumatic. Eyes: Conjunctivae clear. Sclera anicteric. Nose: No congestion. No rhinorrhea. Mouth/Throat: Wearing mask.  Neck: No stridor.   Cardiovascular: Normal rate, regular rhythm. Extremities well perfused. Respiratory: Normal respiratory effort.  Lungs CTAB. Gastrointestinal: Soft. Non-tender. Gravid. FHTs 130s.  Musculoskeletal: No lower extremity edema. No deformities. Neurologic:  Normal speech and language. No gross focal neurologic deficits are appreciated.  Skin: Skin is warm, dry and intact. No rash noted. Psychiatric: Mood and affect are appropriate for situation.  EKG  Personally reviewed.   Rate: 94 Rhythm: sinus Axis: normal Intervals: WNL No acute ischemic changes No STEMI    Radiology  CT PE: IMPRESSION:  1. Negative examination for pulmonary embolism.  2. Extensive bilateral ground-glass and consolidative opacity,  predominantly peripheral in distribution and most conspicuous in the  lower lobes. Findings are consistent with reported COVID-19  infection.  3. Small bilateral pleural effusions.    Procedures  Procedure(s) performed (including critical care):  Procedures   Initial Impression / Assessment and Plan / ED Course  29 y.o. female who presents to the ED for SOB, DOE, pleuritic chest pain. COVID positive on 12/29 and [redacted]  weeks pregnant  Will obtain basic labs and imaging to r/o PE. Discussed risks/benefits of imaging given pregnancy, she is agreeable with proceeding. Also discussed continued aggressive hydration in setting of contrast, getting IVF here, she voices understanding.   CT negative for PE, other findings c/w COVID. In the setting of her tobacco hx, would typically offer albuterol and steroids. Discussed this with her OB on call, who feels both are safe/reasonable at this time (especially as decadron is actually often used in pregnancy around this gestational age for fetal lung maturity in other cases). Will give dose of decadron here, and then albuterol inhaler BID (per OB) as needed. Patient comfortable w/ plan. Advised close outpatient follow up and given return precautions.      Final Clinical Impression(s) / ED Diagnosis  Final diagnoses:  COVID-19  SOB (shortness of breath)  Dyspnea on exertion  Pleuritic chest pain       Note:  This document was prepared using Dragon voice recognition software and may include unintentional dictation errors.   Miguel Aschoff., MD 05/18/19 [phone]

## 2019-05-17 NOTE — Discharge Instructions (Addendum)
Thank you for letting us take care of you in the emergency department today.   Please continue to take any regular, prescribed medications.   New medications we have prescribed:  - Albuterol inhaler - use 2 puffs, no more than twice a day for shortness of breath/cough/chest tightness  Please follow up with: - Your primary care and OB/GYN doctor to review your ER visit and follow up on your symptoms.   Please return to the ER for any new or worsening symptoms.

## 2019-05-17 NOTE — ED Triage Notes (Signed)
Pt here with c/o increasing shob for the past few days now, is [redacted] weeks pregnant, covid positive, was seen at [place] for shob 05/15/19. Appears in no distress at this time. Feeling fetal movement, mild cp with deep breaths. Primary dr and GYN both wanting CT chest to rule out PE.

## 2019-05-17 NOTE — ED Triage Notes (Signed)
FIRST NURSE NOTE-sent by PCP for PE r/o.  Ambulatory. NAD at check in

## 2019-05-20 LAB — CULTURE, BLOOD (ROUTINE X 2): Culture: NO GROWTH

## 2019-05-27 ENCOUNTER — Encounter: Payer: Self-pay | Admitting: Family Medicine

## 2019-05-27 NOTE — Telephone Encounter (Signed)
OK to send refill.  

## 2019-05-30 ENCOUNTER — Other Ambulatory Visit: Payer: Self-pay

## 2019-05-30 MED ORDER — CITALOPRAM HYDROBROMIDE 20 MG PO TABS: 20.0000 mg | ORAL_TABLET | Freq: Every day | ORAL | 3 refills | Status: DC

## 2019-06-06 DIAGNOSIS — Z361 Encounter for antenatal screening for raised alphafetoprotein level: Secondary | ICD-10-CM | POA: Diagnosis not present

## 2019-06-06 LAB — OB RESULTS CONSOLE GBS: GBS: POSITIVE

## 2019-06-14 DIAGNOSIS — Z3A36 36 weeks gestation of pregnancy: Secondary | ICD-10-CM | POA: Diagnosis not present

## 2019-06-14 DIAGNOSIS — O99891 Other specified diseases and conditions complicating pregnancy: Secondary | ICD-10-CM | POA: Diagnosis not present

## 2019-06-14 DIAGNOSIS — Z3400 Encounter for supervision of normal first pregnancy, unspecified trimester: Secondary | ICD-10-CM | POA: Diagnosis not present

## 2019-06-20 ENCOUNTER — Telehealth (HOSPITAL_COMMUNITY): Payer: Self-pay | Admitting: *Deleted

## 2019-06-20 NOTE — Telephone Encounter (Signed)
Preadmission screen  

## 2019-06-21 ENCOUNTER — Encounter (HOSPITAL_COMMUNITY): Payer: Self-pay | Admitting: *Deleted

## 2019-06-27 ENCOUNTER — Encounter: Payer: Self-pay | Admitting: Family Medicine

## 2019-06-27 MED ORDER — CITALOPRAM HYDROBROMIDE 20 MG PO TABS: 20.0000 mg | ORAL_TABLET | Freq: Every day | ORAL | 3 refills | Status: DC

## 2019-06-27 NOTE — H&P (Signed)
Dawn Martinez is a 29 y.o. female presenting for IOL. Pregnancy complicated by anxiety on Celexa. OB History    Gravida  1   Para  0   Term  0   Preterm  0   AB  0   Living  0     SAB  0   TAB  0   Ectopic  0   Multiple  0   Live Births             Past Medical History:  Diagnosis Date  . ADHD (attention deficit hyperactivity disorder)   . Anxiety    Past Surgical History:  Procedure Laterality Date  . APPENDECTOMY     Family History: family history includes ADD / ADHD in her brother; Anxiety disorder in her mother; Congestive Heart Failure in her father; Depression in her mother; Diabetes in her brother and father; Endometriosis in her sister; Gallbladder disease in her sister; Heart disease in her father; Hypertension in her father; Leukemia in her father; Prostate cancer in her father; Thyroid disease in her mother. Social History:  reports that she quit smoking about 4 years ago. She has never used smokeless tobacco. She reports that she does not drink alcohol or use drugs.     Maternal Diabetes: No Genetic Screening: Normal Maternal Ultrasounds/Referrals: Normal Fetal Ultrasounds or other Referrals:  None Maternal Substance Abuse:  No Significant Maternal Medications:  Meds include: Other: Celexa Significant Maternal Lab Results:  Group B Strep positive Other Comments:  None  Review of Systems  Eyes: Negative for visual disturbance.  Gastrointestinal: Negative for abdominal pain.  Neurological: Negative for headaches.   Maternal Medical History:  Fetal activity: Perceived fetal activity is normal.        Last menstrual period 10/02/2018. Maternal Exam:  Abdomen: Fetal presentation: vertex     Physical Exam  Cardiovascular: Normal rate.  Respiratory: Effort normal.  GI: Soft.    Cx  2/50/-2/vtx on 06/27/19  Prenatal labs: ABO, Rh: A/Positive/-- (07/31 0000) Antibody: Negative (07/31 0000) Rubella: Immune (07/31 0000) RPR:  Nonreactive (07/31 0000)  HBsAg: Negative (07/31 0000)  HIV: Non-reactive (07/31 0000)  GBS: Positive/-- (01/25 0000)   Assessment/Plan: 29 yo G1P0 at term for IOL GBBS prophylaxis   Dawn Martinez II 06/27/2019, 5:42 PM

## 2019-06-29 ENCOUNTER — Encounter (HOSPITAL_COMMUNITY): Payer: Self-pay | Admitting: Obstetrics and Gynecology

## 2019-06-29 ENCOUNTER — Inpatient Hospital Stay (HOSPITAL_COMMUNITY): Payer: 59 | Admitting: Anesthesiology

## 2019-06-29 ENCOUNTER — Inpatient Hospital Stay (HOSPITAL_COMMUNITY): Payer: 59

## 2019-06-29 ENCOUNTER — Other Ambulatory Visit: Payer: Self-pay

## 2019-06-29 ENCOUNTER — Inpatient Hospital Stay (HOSPITAL_COMMUNITY)
Admission: AD | Admit: 2019-06-29 | Discharge: 2019-07-01 | DRG: 807 | Disposition: A | Discharge status: Home / Self Care | Payer: 59 | Attending: Obstetrics and Gynecology | Admitting: Obstetrics and Gynecology

## 2019-06-29 DIAGNOSIS — O99214 Obesity complicating childbirth: Secondary | ICD-10-CM | POA: Diagnosis present

## 2019-06-29 DIAGNOSIS — O9902 Anemia complicating childbirth: Secondary | ICD-10-CM | POA: Diagnosis present

## 2019-06-29 DIAGNOSIS — F419 Anxiety disorder, unspecified: Secondary | ICD-10-CM | POA: Diagnosis present

## 2019-06-29 DIAGNOSIS — Z87891 Personal history of nicotine dependence: Secondary | ICD-10-CM | POA: Diagnosis not present

## 2019-06-29 DIAGNOSIS — O99824 Streptococcus B carrier state complicating childbirth: Secondary | ICD-10-CM | POA: Diagnosis present

## 2019-06-29 DIAGNOSIS — D649 Anemia, unspecified: Secondary | ICD-10-CM | POA: Diagnosis not present

## 2019-06-29 DIAGNOSIS — Z3A39 39 weeks gestation of pregnancy: Secondary | ICD-10-CM | POA: Diagnosis not present

## 2019-06-29 DIAGNOSIS — E669 Obesity, unspecified: Secondary | ICD-10-CM | POA: Diagnosis present

## 2019-06-29 DIAGNOSIS — O99344 Other mental disorders complicating childbirth: Principal | ICD-10-CM | POA: Diagnosis present

## 2019-06-29 DIAGNOSIS — O26893 Other specified pregnancy related conditions, third trimester: Secondary | ICD-10-CM | POA: Diagnosis present

## 2019-06-29 DIAGNOSIS — Z3490 Encounter for supervision of normal pregnancy, unspecified, unspecified trimester: Secondary | ICD-10-CM

## 2019-06-29 LAB — CBC
HCT: 35.7 % — ABNORMAL LOW (ref 36.0–46.0)
Hemoglobin: 11.8 g/dL — ABNORMAL LOW (ref 12.0–15.0)
MCH: 30.3 pg (ref 26.0–34.0)
MCHC: 33.1 g/dL (ref 30.0–36.0)
MCV: 91.8 fL (ref 80.0–100.0)
Platelets: 272 10*3/uL (ref 150–400)
RBC: 3.89 MIL/uL (ref 3.87–5.11)
RDW: 14.7 % (ref 11.5–15.5)
WBC: 11.8 10*3/uL — ABNORMAL HIGH (ref 4.0–10.5)
nRBC: 0 % (ref 0.0–0.2)

## 2019-06-29 LAB — TYPE AND SCREEN
ABO/RH(D): A POS
Antibody Screen: NEGATIVE

## 2019-06-29 LAB — ABO/RH: ABO/RH(D): A POS

## 2019-06-29 LAB — RPR: RPR Ser Ql: NONREACTIVE

## 2019-06-29 MED ORDER — LIDOCAINE HCL (PF) 1 % IJ SOLN: 30.0000 mL | INTRAMUSCULAR | Status: DC | PRN

## 2019-06-29 MED ORDER — FLEET ENEMA 7-19 GM/118ML RE ENEM: 1.0000 | ENEMA | RECTAL | Status: DC | PRN

## 2019-06-29 MED ORDER — PHENYLEPHRINE 40 MCG/ML (10ML) SYRINGE FOR IV PUSH (FOR BLOOD PRESSURE SUPPORT): 80.0000 ug | PREFILLED_SYRINGE | INTRAVENOUS | Status: DC | PRN

## 2019-06-29 MED ORDER — COCONUT OIL OIL: 1.0000 "application " | TOPICAL_OIL | Status: DC | PRN

## 2019-06-29 MED ORDER — ONDANSETRON HCL 4 MG/2ML IJ SOLN: 4.0000 mg | Freq: Four times a day (QID) | INTRAMUSCULAR | Status: DC | PRN

## 2019-06-29 MED ORDER — FENTANYL CITRATE (PF) 100 MCG/2ML IJ SOLN: 50.0000 ug | INTRAMUSCULAR | Status: DC | PRN

## 2019-06-29 MED ORDER — BENZOCAINE-MENTHOL 20-0.5 % EX AERO
1.0000 "application " | INHALATION_SPRAY | CUTANEOUS | Status: DC | PRN
  Administered 2019-06-30: 1 via TOPICAL
  Filled 2019-06-29: qty 56

## 2019-06-29 MED ORDER — EPHEDRINE 5 MG/ML INJ: 10.0000 mg | INTRAVENOUS | Status: DC | PRN

## 2019-06-29 MED ORDER — IBUPROFEN 600 MG PO TABS
600.0000 mg | ORAL_TABLET | Freq: Four times a day (QID) | ORAL | Status: DC
  Administered 2019-06-29 – 2019-07-01 (×7): 600 mg via ORAL
  Filled 2019-06-29 (×7): qty 1

## 2019-06-29 MED ORDER — ZOLPIDEM TARTRATE 5 MG PO TABS: 5.0000 mg | ORAL_TABLET | Freq: Every evening | ORAL | Status: DC | PRN

## 2019-06-29 MED ORDER — OXYCODONE HCL 5 MG PO TABS
5.0000 mg | ORAL_TABLET | ORAL | Status: DC | PRN
  Administered 2019-06-30 – 2019-07-01 (×2): 5 mg via ORAL
  Filled 2019-06-29 (×3): qty 1

## 2019-06-29 MED ORDER — OXYCODONE-ACETAMINOPHEN 5-325 MG PO TABS: 2.0000 | ORAL_TABLET | ORAL | Status: DC | PRN

## 2019-06-29 MED ORDER — ONDANSETRON HCL 4 MG PO TABS: 4.0000 mg | ORAL_TABLET | ORAL | Status: DC | PRN

## 2019-06-29 MED ORDER — PRENATAL MULTIVITAMIN CH
1.0000 | ORAL_TABLET | Freq: Every day | ORAL | Status: DC
  Administered 2019-06-30 – 2019-07-01 (×2): 1 via ORAL
  Filled 2019-06-29 (×2): qty 1

## 2019-06-29 MED ORDER — OXYCODONE HCL 5 MG PO TABS: 10.0000 mg | ORAL_TABLET | ORAL | Status: DC | PRN

## 2019-06-29 MED ORDER — ACETAMINOPHEN 325 MG PO TABS: 650.0000 mg | ORAL_TABLET | ORAL | Status: DC | PRN

## 2019-06-29 MED ORDER — TERBUTALINE SULFATE 1 MG/ML IJ SOLN: 0.2500 mg | Freq: Once | INTRAMUSCULAR | Status: DC | PRN

## 2019-06-29 MED ORDER — DIPHENHYDRAMINE HCL 50 MG/ML IJ SOLN
12.5000 mg | INTRAMUSCULAR | Status: DC | PRN
  Administered 2019-06-29: 12.5 mg via INTRAVENOUS
  Filled 2019-06-29: qty 1

## 2019-06-29 MED ORDER — SODIUM CHLORIDE (PF) 0.9 % IJ SOLN
INTRAMUSCULAR | Status: DC | PRN
  Administered 2019-06-29: 11 mL/h via EPIDURAL

## 2019-06-29 MED ORDER — LIDOCAINE HCL (PF) 1 % IJ SOLN
INTRAMUSCULAR | Status: DC | PRN
  Administered 2019-06-29: 4 mL via EPIDURAL
  Administered 2019-06-29: 3 mL via EPIDURAL

## 2019-06-29 MED ORDER — OXYCODONE-ACETAMINOPHEN 5-325 MG PO TABS: 1.0000 | ORAL_TABLET | ORAL | Status: DC | PRN

## 2019-06-29 MED ORDER — SODIUM CHLORIDE 0.9 % IV SOLN
5.0000 10*6.[IU] | Freq: Once | INTRAVENOUS | Status: AC
  Administered 2019-06-29: 01:00:00 5 10*6.[IU] via INTRAVENOUS
  Filled 2019-06-29: qty 5

## 2019-06-29 MED ORDER — ZOLPIDEM TARTRATE 5 MG PO TABS
5.0000 mg | ORAL_TABLET | Freq: Every evening | ORAL | Status: DC | PRN
  Administered 2019-06-29: 01:00:00 5 mg via ORAL
  Filled 2019-06-29: qty 1

## 2019-06-29 MED ORDER — ONDANSETRON HCL 4 MG/2ML IJ SOLN: 4.0000 mg | INTRAMUSCULAR | Status: DC | PRN

## 2019-06-29 MED ORDER — ALBUTEROL SULFATE (2.5 MG/3ML) 0.083% IN NEBU: 3.0000 mL | INHALATION_SOLUTION | Freq: Two times a day (BID) | RESPIRATORY_TRACT | Status: DC | PRN

## 2019-06-29 MED ORDER — DIPHENHYDRAMINE HCL 25 MG PO CAPS: 25.0000 mg | ORAL_CAPSULE | Freq: Four times a day (QID) | ORAL | Status: DC | PRN

## 2019-06-29 MED ORDER — LACTATED RINGERS IV SOLN: INTRAVENOUS | Status: DC

## 2019-06-29 MED ORDER — FENTANYL-BUPIVACAINE-NACL 0.5-0.125-0.9 MG/250ML-% EP SOLN
12.0000 mL/h | EPIDURAL | Status: DC | PRN
  Filled 2019-06-29: qty 250

## 2019-06-29 MED ORDER — LACTATED RINGERS IV SOLN: 500.0000 mL | INTRAVENOUS | Status: DC | PRN

## 2019-06-29 MED ORDER — OXYTOCIN 40 UNITS IN NORMAL SALINE INFUSION - SIMPLE MED
1.0000 m[IU]/min | INTRAVENOUS | Status: DC
  Administered 2019-06-29: 09:00:00 2 m[IU]/min via INTRAVENOUS
  Filled 2019-06-29: qty 1000

## 2019-06-29 MED ORDER — OXYTOCIN BOLUS FROM INFUSION
500.0000 mL | Freq: Once | INTRAVENOUS | Status: AC
  Administered 2019-06-29: 17:00:00 500 mL via INTRAVENOUS

## 2019-06-29 MED ORDER — DIBUCAINE (PERIANAL) 1 % EX OINT: 1.0000 "application " | TOPICAL_OINTMENT | CUTANEOUS | Status: DC | PRN

## 2019-06-29 MED ORDER — WITCH HAZEL-GLYCERIN EX PADS: 1.0000 "application " | MEDICATED_PAD | CUTANEOUS | Status: DC | PRN

## 2019-06-29 MED ORDER — OXYTOCIN 40 UNITS IN NORMAL SALINE INFUSION - SIMPLE MED
2.5000 [IU]/h | INTRAVENOUS | Status: DC
  Administered 2019-06-29: 17:00:00 2.5 [IU]/h via INTRAVENOUS

## 2019-06-29 MED ORDER — TETANUS-DIPHTH-ACELL PERTUSSIS 5-2.5-18.5 LF-MCG/0.5 IM SUSP: 0.5000 mL | Freq: Once | INTRAMUSCULAR | Status: DC

## 2019-06-29 MED ORDER — CITALOPRAM HYDROBROMIDE 20 MG PO TABS
20.0000 mg | ORAL_TABLET | Freq: Every day | ORAL | Status: DC
  Administered 2019-06-29 – 2019-06-30 (×2): 20 mg via ORAL
  Filled 2019-06-29 (×5): qty 1

## 2019-06-29 MED ORDER — MISOPROSTOL 25 MCG QUARTER TABLET
25.0000 ug | ORAL_TABLET | ORAL | Status: DC | PRN
  Administered 2019-06-29 (×2): 25 ug via VAGINAL
  Filled 2019-06-29 (×2): qty 1

## 2019-06-29 MED ORDER — PENICILLIN G POT IN DEXTROSE 60000 UNIT/ML IV SOLN
3.0000 10*6.[IU] | INTRAVENOUS | Status: DC
  Administered 2019-06-29 (×3): 3 10*6.[IU] via INTRAVENOUS
  Filled 2019-06-29 (×3): qty 50

## 2019-06-29 MED ORDER — SENNOSIDES-DOCUSATE SODIUM 8.6-50 MG PO TABS
2.0000 | ORAL_TABLET | ORAL | Status: DC
  Administered 2019-06-29 – 2019-06-30 (×2): 2 via ORAL
  Filled 2019-06-29 (×2): qty 2

## 2019-06-29 MED ORDER — LACTATED RINGERS IV SOLN
500.0000 mL | Freq: Once | INTRAVENOUS | Status: AC
  Administered 2019-06-29: 09:00:00 500 mL via INTRAVENOUS

## 2019-06-29 MED ORDER — ACETAMINOPHEN 325 MG PO TABS
650.0000 mg | ORAL_TABLET | ORAL | Status: DC | PRN
  Administered 2019-06-29 – 2019-07-01 (×4): 650 mg via ORAL
  Filled 2019-06-29 (×4): qty 2

## 2019-06-29 MED ORDER — SOD CITRATE-CITRIC ACID 500-334 MG/5ML PO SOLN: 30.0000 mL | ORAL | Status: DC | PRN

## 2019-06-29 MED ORDER — SIMETHICONE 80 MG PO CHEW: 80.0000 mg | CHEWABLE_TABLET | ORAL | Status: DC | PRN

## 2019-06-29 NOTE — Anesthesia Procedure Notes (Signed)
Epidural Patient location during procedure: OB Start time: 06/29/2019 9:46 AM End time: 06/29/2019 9:54 AM  Staffing Anesthesiologist: Mal Amabile, MD Performed: anesthesiologist   Preanesthetic Checklist Completed: patient identified, IV checked, site marked, risks and benefits discussed, surgical consent, monitors and equipment checked, pre-op evaluation and timeout performed  Epidural Patient position: sitting Prep: DuraPrep and site prepped and draped Patient monitoring: continuous pulse ox and blood pressure Approach: midline Location: L3-L4 Injection technique: LOR air  Needle:  Needle type: Tuohy  Needle gauge: 17 G Needle length: 9 cm and 9 Needle insertion depth: 4 cm Catheter type: closed end flexible Catheter size: 19 Gauge Catheter at skin depth: 9 cm Test dose: negative and Other  Assessment Events: blood not aspirated, injection not painful, no injection resistance, no paresthesia and negative IV test  Additional Notes Patient identified. Risks and benefits discussed including failed block, incomplete  Pain control, post dural puncture headache, nerve damage, paralysis, blood pressure Changes, nausea, vomiting, reactions to medications-both toxic and allergic and post Partum back pain. All questions were answered. Patient expressed understanding and wished to proceed. Sterile technique was used throughout procedure. Epidural site was Dressed with sterile barrier dressing. No paresthesias, signs of intravascular injection Or signs of intrathecal spread were encountered.  Patient was more comfortable after the epidural was dosed. Please see RN's note for documentation of vital signs and FHR which are stable. Reason for block:procedure for pain

## 2019-06-29 NOTE — Progress Notes (Addendum)
Call received from Main Lab at [facility].  Lab states sampled received labeled for [facility] but there is no matching order and no initial on label.  RN concluded the specimen sent was an arterial cord gas on Girl Delsol. RN advised the specimen was inappropriately sent to their location and inquired about receiving specimen back so it can be sent to appropriate destination (NICU).  Lab states can not send specimen back.  RN discussed circumstances with Retail banker.  Charge RN advised to call back and inquire if we can walk down and pick up sample.  RN called Main Lab.  Main Lab states they are not able to locate the sample.   Dr. Henderson Cloud made aware.

## 2019-06-29 NOTE — Progress Notes (Signed)
FHT cat one UCs q2-3 min Cx 3-4/70/-2/vtx AROM >clear

## 2019-06-29 NOTE — Progress Notes (Signed)
FHT cat one Cx 8/C/0 

## 2019-06-29 NOTE — Progress Notes (Signed)
FHT reactive Cx 3 cm per nurse check Pitocin infusing Epidural soon

## 2019-06-29 NOTE — Progress Notes (Signed)
Delivery Note At 4:35 PM a viable female was delivered via  (Presentation:LOA      ).  APGAR: , ; weight  .   Placenta status:intact  ,  .  Cord:  3 vessels with the following complications: none .  Cord pH: pending  Anesthesia:   Episiotomy:second degree MLE repaired Lacerations:   Suture Repair: 2.0 vicryl rapide Est. Blood Loss (mL):  200  Mom to postpartum.  Baby to Couplet care / Skin to Skin.  Dawn Martinez 06/29/2019, 4:51 PM

## 2019-06-29 NOTE — Anesthesia Preprocedure Evaluation (Signed)
Anesthesia Evaluation  Patient identified by MRN, date of birth, ID band Patient awake    Reviewed: Allergy & Precautions, Patient's Chart, lab work & pertinent test results  Airway Mallampati: II  TM Distance: >3 FB Neck ROM: Full    Dental no notable dental hx. (+) Teeth Intact   Pulmonary former smoker,  Covid + 04/2019   Pulmonary exam normal breath sounds clear to auscultation       Cardiovascular negative cardio ROS Normal cardiovascular exam Rhythm:Regular Rate:Normal     Neuro/Psych PSYCHIATRIC DISORDERS Anxiety Depression ADHDnegative neurological ROS     GI/Hepatic Neg liver ROS, GERD  ,  Endo/Other  Obesity  Renal/GU negative Renal ROS  negative genitourinary   Musculoskeletal negative musculoskeletal ROS (+)   Abdominal (+) + obese,   Peds  Hematology  (+) anemia ,   Anesthesia Other Findings   Reproductive/Obstetrics (+) Pregnancy                             Anesthesia Physical Anesthesia Plan  ASA: II  Anesthesia Plan: Epidural   Post-op Pain Management:    Induction:   PONV Risk Score and Plan:   Airway Management Planned: Natural Airway  Additional Equipment:   Intra-op Plan:   Post-operative Plan:   Informed Consent: I have reviewed the patients History and Physical, chart, labs and discussed the procedure including the risks, benefits and alternatives for the proposed anesthesia with the patient or authorized representative who has indicated his/her understanding and acceptance.       Plan Discussed with: Anesthesiologist  Anesthesia Plan Comments:         Anesthesia Quick Evaluation

## 2019-06-30 LAB — CBC
HCT: 32.8 % — ABNORMAL LOW (ref 36.0–46.0)
Hemoglobin: 10.9 g/dL — ABNORMAL LOW (ref 12.0–15.0)
MCH: 30.6 pg (ref 26.0–34.0)
MCHC: 33.2 g/dL (ref 30.0–36.0)
MCV: 92.1 fL (ref 80.0–100.0)
Platelets: 229 10*3/uL (ref 150–400)
RBC: 3.56 MIL/uL — ABNORMAL LOW (ref 3.87–5.11)
RDW: 14.7 % (ref 11.5–15.5)
WBC: 13.9 10*3/uL — ABNORMAL HIGH (ref 4.0–10.5)
nRBC: 0 % (ref 0.0–0.2)

## 2019-06-30 NOTE — Progress Notes (Signed)
PPD # 1  Doing well BP 121/89 (BP Location: Left Arm)   Pulse 72   Temp 98.2 F (36.8 C)   Resp 17   Ht 5' (1.524 m)   Wt 85 kg   LMP 10/02/2018   SpO2 99%   Breastfeeding Unknown   BMI 36.58 kg/m  Results for orders placed or performed during the hospital encounter of 06/29/19 (from the past 24 hour(s))  CBC     Status: Abnormal   Collection Time: 06/30/19  6:24 AM  Result Value Ref Range   WBC 13.9 (H) 4.0 - 10.5 K/uL   RBC 3.56 (L) 3.87 - 5.11 MIL/uL   Hemoglobin 10.9 (L) 12.0 - 15.0 g/dL   HCT 72.0 (L) 91.9 - 80.2 %   MCV 92.1 80.0 - 100.0 fL   MCH 30.6 26.0 - 34.0 pg   MCHC 33.2 30.0 - 36.0 g/dL   RDW 21.7 98.1 - 02.5 %   Platelets 229 150 - 400 K/uL   nRBC 0.0 0.0 - 0.2 %   Abdomen is soft and non tender Lochia WNL  PPD # 1  Doing well Routine care

## 2019-06-30 NOTE — Lactation Note (Signed)
This note was copied from a baby's chart. Lactation Consultation Note  Patient Name: Dawn Martinez Today's Date: 06/30/2019  Telephone call from RN reports parents and RN can't get infant latched would [facility] see her.   Entered room and parents have all lights off and using phone to try and latch baby. Parents report she has been crying off and on for the last three hours and they are unable to console her. Parents report she has only had drops to eat and they know she has got to be hungry.  Infant laying next to mom sucking her thumb currently.   Mom sitting in chair, has baby beside her.  Asked if I could touch mom and assist with breastfeeding. Mom in agreement.Mom reports she has used DEBP twice since she has been born. Mom has short nipples that do become more erect with stimulation.  Infant will latch for just a minute and come off and cry.  After a few attempts.  Placed 24 mm nipple shield on mom.  Showed mom how to hand express and stretch nipple shield prior to placement. Infant latched and breastfed well.  Audible swallows heard.  Explained to parents that this was just a tool to assist with feeding that we try not to use at first. Explained to hand express prior to applying nipple shield and hand express and pump past using the nipple shield . Let parents know if she is cluster feeding tonight they may not get much pumping in after.  After infant calm, got mom to break the suction and take infant off.  Infant latched back on without the nipple shield for another minute or so.  Then came off, fussy, and she had a poop. Urged to always try without it first, then try once she is latched well to break suction, take her off and relatch her without it if has to use it.  Instructed cleaning with dad. Urged to call lactation as needed.     Maternal Data    Feeding Feeding Type: Breast Fed  LATCH Score Latch: Repeated attempts needed to sustain latch, nipple held in mouth throughout feeding,  stimulation needed to elicit sucking reflex.  Audible Swallowing: None  Type of Nipple: Everted at rest and after stimulation  Comfort (Breast/Nipple): Soft / non-tender  Hold (Positioning): Assistance needed to correctly position infant at breast and maintain latch.  LATCH Score: 6  Interventions    Lactation Tools Discussed/Used     Consult Status      [name] Michaelle Copas 06/30/2019, 10:26 PM

## 2019-06-30 NOTE — Lactation Note (Signed)
This note was copied from a baby's chart. Lactation Consultation Note  Patient Name: Dawn Martinez [id number] Date: 06/30/2019 Reason for consult: Initial assessment;1st time breastfeeding;Term P1, 6 hour female infant. Infant had one void and one stool. Mom with hx of ADD and Anxiety on [place] Mom is a Runner, broadcasting/film/video and was given [employer] DEBP with insurance by [facility]. Infant was to sleepy to latch at this time only held breast in mouth but would not swallow, mom will continue to hand express and do STS with infant. Dad was little upset when LC was providing teaching with Mother using infant booklet about STS, hand expression and latch postions and stated "I hate Lactation".. Dad didn't hear LC mention to mom that it is normal for infant to be sleepy at first 24 hours of life and write down latch attempts, keep doing STS and hand expression with infant. Mom will continue to work with latching infant at breast according to hunger cues, 8 to 12 times within 24 hours on demand, not exceed 3 hours without attempting to latch infant at breast. LC mention if Mom preferred other LC she could ask her to finish consult with family. Mom knows to call RN or LC to assist with latching infant at breast. LC brought medela DEBP to room and RN will set up and explain how to use with parents. Mom will use DEBP every 3 hours for 15 minutes on initial setting. Dad came back to room and apologize to [facility] and LC stated it was no need that she is here to help mom, dad and baby and wants mom to have a good breastfeeding experience in hospital and when she is at home. LC reassured mom, RN and LC will continue to help assist her in latching infant at breast, per mom , she has flat nipples, LC ask mom,  who told her that, mom said's she just knows, LC showed mom how well her breast responds to stimulation and praised her that infant did latch but just wasn't sucking at this time. Mom will continue to work with  latching infant at breast and after 24 hours can reassess,  we do have donor breast milk  available if needed.  Mom was given hand pump by RN to pre-pump breast prior to latching infant. Reviewed Baby & Me book's Breastfeeding Basics.  LC discussed with mom at discharge LC out patient  services, LC hot line and LC breastfeeding support group in Mother and infant booklet.        Maternal Data Formula Feeding for Exclusion: No Has patient been taught Hand Expression?: Yes Does the patient have breastfeeding experience prior to this delivery?: No  Feeding Feeding Type: Breast Fed  LATCH Score Latch: Too sleepy or reluctant, no latch achieved, no sucking elicited.  Audible Swallowing: None  Type of Nipple: Everted at rest and after stimulation  Comfort (Breast/Nipple): Soft / non-tender  Hold (Positioning): Assistance needed to correctly position infant at breast and maintain latch.  LATCH Score: 5  Interventions Interventions: Breast feeding basics reviewed;Breast compression;Adjust position;Assisted with latch;Hand pump;DEBP;Support pillows;Skin to skin;Breast massage;Position options;Hand express;Expressed milk;Pre-pump if needed  Lactation Tools Discussed/Used Tools: Pump Breast pump type: Double-Electric Breast Pump;Manual WIC Program: No Pump Review: Setup, frequency, and cleaning;Milk Storage Initiated by:: By RN Date initiated:: 06/30/19   Consult Status Consult Status: Follow-up Date: 06/30/19 Follow-up type: In-patient    Danelle Earthly 06/30/2019, 12:01 AM

## 2019-06-30 NOTE — Anesthesia Postprocedure Evaluation (Signed)
Anesthesia Post Note  Patient: Dawn Martinez  Procedure(s) Performed: AN AD HOC LABOR EPIDURAL     Patient location during evaluation: Mother Baby Anesthesia Type: Epidural Level of consciousness: awake and alert Pain management: pain level controlled Vital Signs Assessment: post-procedure vital signs reviewed and stable Respiratory status: spontaneous breathing Cardiovascular status: stable Postop Assessment: no headache, no backache, epidural receding and able to ambulate Anesthetic complications: no    Last Vitals:  Vitals:   06/29/19 2345 06/30/19 0543  BP: 118/68 121/89  Pulse: 79 72  Resp: 17 17  Temp: 36.7 C 36.8 C  SpO2:      Last Pain:  Vitals:   06/30/19 0544  TempSrc:   PainSc: 0-No pain   Pain Goal:                   Edison Pace

## 2019-06-30 NOTE — Lactation Note (Signed)
This note was copied from a baby's chart. Lactation Consultation Note  Patient Name: [name] [id number] Date: 06/30/2019   "Dawn Martinez" is 23 hrs old. When I entered room, Mom was trying to get infant to latch, but infant was sleeping. I spoke to parents about enticing her to wake to eat by offering EBM. Dad has been helping Mom with hand expression & giving infant drops.   Mom was agreeable to learning a different hand expression technique & she was more readily able to give drops of colostrum. This was spoon-fed to infant, who lapped it up eagerly.   Mom was placed in a side-lying position & infant was placed next to her. Mom's nipples do become more erect/everted with stimulation. Infant was able to latch briefly a few times, at which point Mom would exclaim with happiness. Per parents, this is the best she has done so far.   Dawn Martinez did soon fall back to sleep, but Mom continues to be skin-to-skin with infant in a side-lying position in the hopes that she'll wake up again. Specifics of an asymmetric latch were shown via [employer]. I also showed Dad how to do a teacup hold to help Mom.   Parents were very appreciative of care.  Lurline Hare [facility] 06/30/2019, 1:37 PM

## 2019-06-30 NOTE — Progress Notes (Signed)
MOB was referred for history of anxiety/ADHD.  * Referral screened out by Clinical Social Worker because none of the following criteria appear to apply:  ~ History of anxiety/depression during this pregnancy, or of post-partum depression following prior delivery. ~ Diagnosis of anxiety and/or depression within last 3 years OR * MOB's symptoms currently being treated with medication and/or therapy. MOB is currently prescribed and taking Celexa.   Please contact the Clinical Social Worker if needs arise, by [facility] request, or if MOB scores greater than 9/yes to question 10 on Edinburgh Postpartum Depression Screen.  Lear Ng, LCSW [facility] [employer] [phone]

## 2019-07-01 MED ORDER — IBUPROFEN 600 MG PO TABS: 600.0000 mg | ORAL_TABLET | Freq: Four times a day (QID) | ORAL | 0 refills | Status: DC

## 2019-07-01 NOTE — Discharge Summary (Signed)
Obstetric Discharge Summary Reason for Admission: induction of labor Prenatal Procedures: none Intrapartum Procedures: spontaneous vaginal delivery Postpartum Procedures: none Complications-Operative and Postpartum: 2nd degree perineal laceration Hemoglobin  Date Value Ref Range Status  06/30/2019 10.9 (L) 12.0 - 15.0 g/dL Final  94/32/7614 70.9 11.1 - 15.9 g/dL Final   HCT  Date Value Ref Range Status  06/30/2019 32.8 (L) 36.0 - 46.0 % Final   Hematocrit  Date Value Ref Range Status  12/20/2014 38.2 34.0 - 46.6 % Final    Physical Exam:  General: alert, cooperative and appears stated age Lochia: appropriate Uterine Fundus: firm Incision: healing well, no significant drainage, no dehiscence DVT Evaluation: No evidence of DVT seen on physical exam. Negative Homan's sign. No cords or calf tenderness.  Discharge Diagnoses: Term Pregnancy-delivered  Discharge Information: Date: 07/01/2019 Activity: pelvic rest Diet: routine Medications: PNV and Ibuprofen Condition: stable Instructions: refer to practice specific booklet Discharge to: home   Newborn Data: Live born female  Birth Weight: 8 lb 1.8 oz (3680 g) APGAR: 7, 9  Newborn Delivery   Birth date/time: 06/29/2019 16:35:00 Delivery type: Vaginal, Spontaneous      Home with mother.  Mitchel Honour 07/01/2019, 8:44 AM

## 2019-07-01 NOTE — Discharge Instructions (Signed)
Call MD for T>100.4, heavy vaginal bleeding, severe abdominal pain, or respiratory distress.  Call office to schedule postpartum visit in 6 weeks.  Pelvic rest x 6 weeks.   

## 2019-07-01 NOTE — Lactation Note (Signed)
This note was copied from a baby's chart. Lactation Consultation Note  Patient Name: Dawn Martinez [id number] Date: 07/01/2019   Mom has been using a size 24 nipple shield. Mom says she's getting more comfortable with feedings. When asked, Mom says she hears a lot of swallows.   Mom appeared as if she was getting ready to pump. Since infant is due to eat soon, I asked Mom to hold off on pumping until after the feeding. Mom stated she did not pump last night & has not pumped today, yet.   Mom will call me so I can observe how infant does with the next feeding. Infant is currently sleeping soundly in the bassinet.    Lurline Hare [facility] 07/01/2019, 8:44 AM

## 2019-07-01 NOTE — Lactation Note (Signed)
This note was copied from a baby's chart. Lactation Consultation Note  Patient Name: Dawn Martinez Today's Date: 07/01/2019   I observed infant at breast; no signs of milk transfer were noted. Parents were amenable to learning how to supp at breast w/5Fr tube & syringe.   Infant took 6 mL of formula + what she could obtain from breast & seemed content. Dad was shown how to clean parts. Volume parameters based on day of life were provided w/understanding to feed "Dawn Martinez" more if she wants more.   I provided an extra size 24 NS appropriate, tubing & syringes. Parents know how to reach [place] post-discharge if needed. Request sent to outpatient clinic for a f/u lactation appt. Mom encouraged to add in regular pumping after she goes home & gets some sleep   Consult Status Consult Status: Complete    Remigio Eisenmenger 07/01/2019, 12:26 PM

## 2019-08-04 ENCOUNTER — Encounter: Payer: Self-pay | Admitting: Family Medicine

## 2019-08-04 MED ORDER — CITALOPRAM HYDROBROMIDE 20 MG PO TABS: 20.0000 mg | ORAL_TABLET | Freq: Every day | ORAL | 1 refills | Status: DC

## 2019-08-04 NOTE — Telephone Encounter (Signed)
OK to send in refill for 90 day supply with 1 refill

## 2019-08-19 DIAGNOSIS — Z1389 Encounter for screening for other disorder: Secondary | ICD-10-CM | POA: Diagnosis not present

## 2019-08-19 DIAGNOSIS — F53 Postpartum depression: Secondary | ICD-10-CM | POA: Diagnosis not present

## 2019-08-19 DIAGNOSIS — Z309 Encounter for contraceptive management, unspecified: Secondary | ICD-10-CM | POA: Diagnosis not present

## 2019-08-22 ENCOUNTER — Encounter: Payer: Self-pay | Admitting: Family Medicine

## 2019-08-22 ENCOUNTER — Other Ambulatory Visit: Payer: Self-pay

## 2019-08-22 ENCOUNTER — Telehealth: Payer: 59 | Admitting: Family Medicine

## 2019-08-22 ENCOUNTER — Ambulatory Visit: Payer: 59 | Admitting: Family Medicine

## 2019-08-22 ENCOUNTER — Telehealth (INDEPENDENT_AMBULATORY_CARE_PROVIDER_SITE_OTHER): Payer: 59 | Admitting: Family Medicine

## 2019-08-22 DIAGNOSIS — F988 Other specified behavioral and emotional disorders with onset usually occurring in childhood and adolescence: Secondary | ICD-10-CM | POA: Diagnosis not present

## 2019-08-22 DIAGNOSIS — F411 Generalized anxiety disorder: Secondary | ICD-10-CM | POA: Diagnosis not present

## 2019-08-22 MED ORDER — METHYLPHENIDATE HCL 5 MG PO TABS: 5.0000 mg | ORAL_TABLET | Freq: Two times a day (BID) | ORAL | 0 refills | Status: DC

## 2019-08-22 NOTE — Assessment & Plan Note (Signed)
Chronic and well controlled Denies any PPD Followed with GYN currently Recent dose increase in Celexa- tolerating well

## 2019-08-22 NOTE — Progress Notes (Signed)
MyChart Video Visit   Virtual Visit via Video Note   This visit type was conducted due to national recommendations for restrictions regarding the COVID-19 Pandemic (e.g. social distancing) in an effort to limit this patient's exposure and mitigate transmission in our community. This patient is at least at moderate risk for complications without adequate follow up. This format is felt to be most appropriate for this patient at this time. Physical exam was limited by quality of the video and audio technology used for the visit.    Patient location: home Provider location: [place] involved in the visit: patient, provider   Patient: Dawn Martinez   DOB: 12/22/1990   29 y.o. Female  MRN: [id number] Visit Date: 08/22/2019  Today's Provider: [name], MD  Subjective:    Chief Complaint  Patient presents with  . ADHD    Pt wants to discuss restarting her Ritalin   HPI Baby is about 2 months old.  She is eating mostly formula, but nighttime breastmilk.  Patient is returning to work and havign difficulty concentrating Wants to resume ritalin  Patient Active Problem List   Diagnosis Date Noted  . IBS (irritable bowel syndrome) 02/02/2017  . ADD (attention deficit disorder) 02/14/2015  . ASCUS favoring benign 11/03/2014  . GAD (generalized anxiety disorder) 11/03/2014  . MDD (major depressive disorder) 11/03/2014  . 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 Allergic rhinitis, seasonal 11/03/2014   Social History   Tobacco Use  . Smoking status: Former Smoker    Quit date: 11/18/2014    Years since quitting: 4.7  . Smokeless tobacco: Never Used  Substance Use Topics  . Alcohol use: No    Alcohol/week: 0.0 standard drinks    Comment: occasional  . Drug use: No      Medications: Outpatient Medications Prior to Visit  Medication Sig  . acetaminophen (TYLENOL) 500 MG tablet Take 1,000 mg by mouth every 6 (six) hours as  needed for mild pain or headache.   . albuterol (VENTOLIN HFA) 108 (90 Base) MCG/ACT inhaler Inhale 2 puffs into the lungs 2 (two) times daily as needed for wheezing or shortness of breath.  . calcium carbonate (TUMS - DOSED IN MG ELEMENTAL CALCIUM) 500 MG chewable tablet Chew 2 tablets by mouth 3 (three) times daily as needed.   . citalopram (CELEXA) 20 MG tablet Take 1 tablet (20 mg total) by mouth at bedtime.  . ferrous sulfate 325 (65 FE) MG tablet Take 325 mg by mouth daily with breakfast.  . fluticasone (FLONASE) 50 MCG/ACT nasal spray Place 2 sprays into both nostrils daily.  Marland Kitchen ibuprofen (ADVIL) 600 MG tablet Take 1 tablet (600 mg total) by mouth every 6 (six) hours.  Marland Kitchen omeprazole (PRILOSEC) 20 MG capsule Take 20 mg by mouth daily.  . Prenatal Vit-Fe Fumarate-FA (PRENATAL MULTIVITAMIN) TABS tablet Take 1 tablet by mouth daily at 12 noon.   No facility-administered medications prior to visit.    Last CBC Lab Results  Component Value Date   WBC 13.9 (H) 06/30/2019   HGB 10.9 (L) 06/30/2019   HCT 32.8 (L) 06/30/2019   MCV 92.1 06/30/2019   MCH 30.6 06/30/2019   RDW 14.7 06/30/2019   PLT 229 36/62/9476   Last metabolic panel Lab Results  Component Value Date   GLUCOSE 117 (H) 05/17/2019   NA 136 05/17/2019   K 3.1 (L) 05/17/2019   CL 104 05/17/2019   CO2 22 05/17/2019   BUN  5 (L) 05/17/2019   CREATININE 0.48 05/17/2019   GFRNONAA >60 05/17/2019   GFRAA >60 05/17/2019   CALCIUM 8.8 (L) 05/17/2019   PROT 5.7 (L) 05/15/2019   ALBUMIN 2.6 (L) 05/15/2019   LABGLOB 2.4 11/04/2017   AGRATIO 2.0 11/04/2017   BILITOT 0.3 05/15/2019   ALKPHOS 90 05/15/2019   AST 31 05/15/2019   ALT 33 05/15/2019   ANIONGAP 10 05/17/2019      Review of Systems       Objective:    LMP 10/02/2018  BP Readings from Last 3 Encounters:  07/01/19 116/68  05/17/19 104/67  05/15/19 113/65   Wt Readings from Last 3 Encounters:  06/29/19 187 lb 4.8 oz (85 kg)  05/17/19 180 lb (81.6 kg)   03/13/19 171 lb 14.4 oz (78 kg)      Physical Exam Vitals reviewed.  Constitutional:      General: She is not in acute distress.    Appearance: Normal appearance. She is not diaphoretic.  HENT:     Head: Normocephalic and atraumatic.  Pulmonary:     Effort: Pulmonary effort is normal. No respiratory distress.  Neurological:     Mental Status: She is alert and oriented to person, place, and time. Mental status is at baseline.  Psychiatric:        Mood and Affect: Mood normal.        Behavior: Behavior normal.          Assessment & Plan:    I discussed the assessment and treatment plan with the patient. The patient was provided an opportunity to ask questions and all were answered. The patient agreed with the plan and demonstrated an understanding of the instructions.   The patient was advised to call back or seek an in-person evaluation if the symptoms worsen or if the condition fails to improve as anticipated.  Problem List Items Addressed This Visit      Other   GAD (generalized anxiety disorder)    Chronic and well controlled Denies any PPD Followed with GYN currently Recent dose increase in Celexa- tolerating well      ADD (attention deficit disorder) - Primary    Chronic and uncontrolled Previously well controlled on Ritalin Discussed risks and benefits and unknowns of taking stimulant while breastfeeding Sounds like she is breastfeeding minimally at this time Agree to resume at the low dose that she was previously taking F/u in 3 months          Return in about 3 months (around 11/21/2019) for chronic disease f/u.   The entirety of the information documented in the History of Present Illness, Review of Systems and Physical Exam were personally obtained by me. Portions of this information were initially documented by Kavin Leech, CMA and reviewed by me for thoroughness and accuracy.    Stacy Deshler, Marzella Schlein, MD MPH [facility]

## 2019-08-22 NOTE — Assessment & Plan Note (Signed)
Chronic and uncontrolled Previously well controlled on Ritalin Discussed risks and benefits and unknowns of taking stimulant while breastfeeding Sounds like she is breastfeeding minimally at this time Agree to resume at the low dose that she was previously taking F/u in 3 months

## 2019-09-06 DIAGNOSIS — G5603 Carpal tunnel syndrome, bilateral upper limbs: Secondary | ICD-10-CM | POA: Diagnosis not present

## 2019-09-06 DIAGNOSIS — M654 Radial styloid tenosynovitis [de Quervain]: Secondary | ICD-10-CM | POA: Diagnosis not present

## 2019-10-14 ENCOUNTER — Other Ambulatory Visit (INDEPENDENT_AMBULATORY_CARE_PROVIDER_SITE_OTHER): Payer: Self-pay | Admitting: Internal Medicine

## 2019-10-14 MED ORDER — DICYCLOMINE HCL 10 MG PO CAPS: 10.0000 mg | ORAL_CAPSULE | Freq: Three times a day (TID) | ORAL | 0 refills | Status: DC

## 2019-10-14 NOTE — Telephone Encounter (Signed)
Dawn Martinez is 14 weeks postpartum. She is experiencing crampy abdominal pain primarily on the left side with diarrhea.  No history of rectal bleeding fever or chills.  No history of recent antibiotic.  She saw her obstetrician 6 weeks after pregnancy and she is doing fine from a gynecologic standpoint. She does have history of IBS. Abdominal examination reveals linear stria normal bowel sounds.  Abdomen is soft.  She has mild tenderness in left mid and left upper quadrant.  No guarding rebound or organomegaly or masses.  Suspect symptoms due to IBS.  We will treat her with dicyclomine.  If she does not respond she will need stool studies and further evaluation. Prescription for dicyclomine sent to to [facility]. Patient informed of potential side effects which include dry mouth and constipation.  She may try taking it twice a day to begin with. Please note that patient is not breast-feeding her child anymore.

## 2019-11-08 ENCOUNTER — Encounter: Payer: Self-pay | Admitting: Family Medicine

## 2019-11-08 DIAGNOSIS — F411 Generalized anxiety disorder: Secondary | ICD-10-CM

## 2019-11-09 MED ORDER — CITALOPRAM HYDROBROMIDE 40 MG PO TABS: ORAL_TABLET | ORAL | 0 refills | Status: DC

## 2019-12-22 ENCOUNTER — Ambulatory Visit (INDEPENDENT_AMBULATORY_CARE_PROVIDER_SITE_OTHER): Payer: 59 | Admitting: Physician Assistant

## 2019-12-22 ENCOUNTER — Other Ambulatory Visit: Payer: Self-pay

## 2019-12-22 ENCOUNTER — Encounter: Payer: Self-pay | Admitting: Physician Assistant

## 2019-12-22 VITALS — BP 122/78 | HR 82 | Temp 98.6°F | Wt 173.0 lb

## 2019-12-22 DIAGNOSIS — Z720 Tobacco use: Secondary | ICD-10-CM | POA: Diagnosis not present

## 2019-12-22 DIAGNOSIS — F1010 Alcohol abuse, uncomplicated: Secondary | ICD-10-CM

## 2019-12-22 MED ORDER — NICOTINE 14 MG/24HR TD PT24: 14.0000 mg | MEDICATED_PATCH | Freq: Every day | TRANSDERMAL | 0 refills | Status: DC

## 2019-12-22 MED ORDER — GABAPENTIN 300 MG PO CAPS: 300.0000 mg | ORAL_CAPSULE | Freq: Three times a day (TID) | ORAL | 0 refills | Status: DC

## 2019-12-22 NOTE — Progress Notes (Signed)
Established patient visit   Patient: Dawn Martinez   DOB: 08/09/1990   29 y.o. Female  MRN: [id number] Visit Date: 12/22/2019  Today's healthcare provider: Trey Sailors, PA-C   Chief Complaint  Patient presents with  . Alcohol Problem  I,Porsha C McClurkin,acting as a scribe for Trey Sailors, PA-C.,have documented all relevant documentation on the behalf of Trey Sailors, PA-C,as directed by  Trey Sailors, PA-C while in the presence of Trey Sailors, PA-C.  Subjective    HPI   Alcohol Problem Patient reports that she was drinking heavy before her pregnancy and then after pregnancy she noticed herself drinking more than usual. Patient reports that she is going through stuff in her marriage. Patient reports mostly drinking beer.  Reports she drank socially as a teenager from age 29 sociallt at bars, parties. Started drinking more heavily since 2018. Stopped when she was pregnant. after pregnancy she reports PPD in 06/2019. In 2018 she was drinking 12 beers twice a week. Other days she was drinking nothing. This continued until pregnancy. She stopped during pregnancy. 08/2019 Would drink every day 6-7 beers 4 days a week. Currently drinking. Last week had nothing. Last night drank 15 beers. No DWI. No arrest. No falling down while drunk.   She is currently living with her mother because she feels she has too many triggers at home to get clean from alcohol. She is currently followed by a therapist.   Patient interested in using gabapentin for alcohol abuse. Currently works as a Occupational psychologist two days per week.   Patient also has questions about COVID vaccine.     Medications: Outpatient Medications Prior to Visit  Medication Sig  . acetaminophen (TYLENOL) 500 MG tablet Take 1,000 mg by mouth every 6 (six) hours as needed for mild pain or headache.   . citalopram (CELEXA) 40 MG tablet citalopram 40 mg tablet  . fluticasone (FLONASE) 50 MCG/ACT nasal spray Place  2 sprays into both nostrils daily.  Marland Kitchen ibuprofen (ADVIL) 600 MG tablet Take 1 tablet (600 mg total) by mouth every 6 (six) hours.  . methylphenidate (RITALIN) 5 MG tablet Take 1 tablet (5 mg total) by mouth 2 (two) times daily.  . methylphenidate (RITALIN) 5 MG tablet Take 1 tablet (5 mg total) by mouth 2 (two) times daily.  . methylphenidate (RITALIN) 5 MG tablet Take 1 tablet (5 mg total) by mouth 2 (two) times daily.  Marland Kitchen albuterol (VENTOLIN HFA) 108 (90 Base) MCG/ACT inhaler Inhale 2 puffs into the lungs 2 (two) times daily as needed for wheezing or shortness of breath. (Patient not taking: Reported on 12/22/2019)  . calcium carbonate (TUMS - DOSED IN MG ELEMENTAL CALCIUM) 500 MG chewable tablet Chew 2 tablets by mouth 3 (three) times daily as needed.  (Patient not taking: Reported on 12/22/2019)  . dicyclomine (BENTYL) 10 MG capsule Take 1 capsule (10 mg total) by mouth 3 (three) times daily before meals. (Patient not taking: Reported on 12/22/2019)  . ferrous sulfate 325 (65 FE) MG tablet Take 325 mg by mouth daily with breakfast. (Patient not taking: Reported on 12/22/2019)  . omeprazole (PRILOSEC) 20 MG capsule Take 20 mg by mouth daily. (Patient not taking: Reported on 12/22/2019)  . Prenatal Vit-Fe Fumarate-FA (PRENATAL MULTIVITAMIN) TABS tablet Take 1 tablet by mouth daily at 12 noon. (Patient not taking: Reported on 12/22/2019)   No facility-administered medications prior to visit.    Review of Systems    Objective  BP 122/78 (BP Location: Right Arm, Patient Position: Sitting, Cuff Size: Normal)   Pulse 82   Temp 98.6 F (37 C) (Oral)   Wt 173 lb (78.5 kg)   SpO2 (!) 82%   BMI 33.79 kg/m    Physical Exam Constitutional:      Appearance: Normal appearance.  Cardiovascular:     Rate and Rhythm: Normal rate and regular rhythm.     Heart sounds: Normal heart sounds.  Pulmonary:     Effort: Pulmonary effort is normal.     Breath sounds: Normal breath sounds.  Skin:    General:  Skin is warm and dry.  Neurological:     Mental Status: She is alert and oriented to person, place, and time. Mental status is at baseline.  Psychiatric:        Mood and Affect: Mood normal.        Behavior: Behavior normal.       No results found for any visits on 12/22/19.  Assessment & Plan    1. Alcohol abuse  Counseled on gabapentin off label use for alcohol abuse. She can try this. Also disulfiram as deterrent and naltrexone are options. Follow up one month with PCP. Counseled on benefits of COVID vaccine and advised she should get it.   - gabapentin (NEURONTIN) 300 MG capsule; Take 1 capsule (300 mg total) by mouth 3 (three) times daily.  Dispense: 90 capsule; Refill: 0  2. Tobacco abuse  I have spent > 3 minutes counseling about tobacco cessation.   - nicotine (NICODERM CQ) 14 mg/24hr patch; Place 1 patch (14 mg total) onto the skin daily.  Dispense: 28 patch; Refill: 0    No follow-ups on file.      ITrey Sailors, PA-C, have reviewed all documentation for this visit. The documentation on 12/28/19 for the exam, diagnosis, procedures, and orders are all accurate and complete.  The entirety of the information documented in the History of Present Illness, Review of Systems and Physical Exam were personally obtained by me. Portions of this information were initially documented by [facility] and reviewed by me for thoroughness and accuracy.   I spent 30 minutes dedicated to the care of this patient on the date of this encounter to include pre-visit review of records, face-to-face time with the patient discussing alcohol abuse, treatments, covid vaccination  and post visit ordering of testing.    Maryella Shivers  [facility] [phone] (phone) [phone] (fax)  [facility]

## 2020-01-05 ENCOUNTER — Ambulatory Visit: Payer: Self-pay | Admitting: Family Medicine

## 2020-02-08 ENCOUNTER — Other Ambulatory Visit: Payer: Self-pay | Admitting: Family Medicine

## 2020-02-08 DIAGNOSIS — F1010 Alcohol abuse, uncomplicated: Secondary | ICD-10-CM

## 2020-02-08 DIAGNOSIS — F411 Generalized anxiety disorder: Secondary | ICD-10-CM

## 2020-02-08 MED ORDER — GABAPENTIN 300 MG PO CAPS: 300.0000 mg | ORAL_CAPSULE | Freq: Three times a day (TID) | ORAL | 0 refills | Status: DC

## 2020-02-08 MED ORDER — CITALOPRAM HYDROBROMIDE 40 MG PO TABS: ORAL_TABLET | ORAL | 0 refills | Status: DC

## 2020-02-08 NOTE — Telephone Encounter (Signed)
Already sent to PCP

## 2020-02-08 NOTE — Telephone Encounter (Signed)
PT need a refill  methylphenidate (RITALIN) 5 MG tablet [[id number]]  [facility] [employer] - [place], [place] - [address]  [address] [place] [address]  Phone: [phone] Fax: [phone]

## 2020-02-08 NOTE — Telephone Encounter (Signed)
Requested Prescriptions  Pending Prescriptions Disp Refills  . citalopram (CELEXA) 40 MG tablet 90 tablet 0    Sig: citalopram 40 mg tablet     Psychiatry:  Antidepressants - SSRI Failed - 02/08/2020 12:01 PM      Failed - Completed PHQ-2 or PHQ-9 in the last 360 days.      Passed - Valid encounter within last 6 months    Recent Outpatient Visits          1 month ago Alcohol abuse   [facility] Os[place]do [place]st [place], [place]   5 months ago Attention deficit disorder (ADD) without hyperactivity   [facility], Marzella Schlein, [place]   1 year ago Scabies   [facility] [place], Marzella Schlein, MD   1 year ago GAD (generalized anxiety disorder)   [facility], Marzella Schlein, MD   1 year ago Attention deficit disorder (ADD) without hyperactivity   [facility], Marzella Schlein, MD      Future Appointments            In 3 weeks Bac[place], Marzella Schlein, MD [facility]   In 3 months Bacigalupo, Marzella Schlein, MD [facility]           . gabapentin (NEURONTIN) 300 MG capsule 90 capsule 0    Sig: Take 1 capsule (300 mg total) by mouth 3 (three) times daily.     Neurology: Anticonvulsants - gabapentin Passed - 02/08/2020 12:01 PM      Passed - Valid encounter within last 12 months    Recent Outpatient Visits          1 month ago Alcohol abuse   [facility], [place], [place]   5 months ago Attention deficit disorder (ADD) without hyperactivity   [facility], Marzella Schlein, MD   1 year ago Scabies   [facility] [place], Marzella Schlein, MD   1 year ago GAD (generalized anxiety disorder)   [facility], Marzella Schlein, MD   1 year ago Attention deficit disorder (ADD) without hyperactivity   [facility] [place], Marzella Schlein, MD      Future Appointments            In 3 weeks Bacigalupo,  Marzella Schlein, MD [facility]   In 3 months Bacigalupo, Marzella Schlein, MD [facility]           . methylphenidate (RITALIN) 5 MG tablet 60 tablet 0    Sig: Take 1 tablet (5 mg total) by mouth 2 (two) times daily.     Not Delegated - Psychiatry:  Stimulants/ADHD Failed - 02/08/2020 12:01 PM      Failed - This refill cannot be delegated      Failed - Urine Drug Screen completed in last 360 days.      Passed - Valid encounter within last 3 months    Recent Outpatient Visits          1 month ago Alcohol abuse   [facility] Osvaldo Angst M, [place]   5 months ago Attention deficit disorder (ADD) without hyperactivity   [facility], Marzella Schlein, MD   1 year ago Scabies   [facility], Marzella Schlein, MD   1 year ago GAD (generalized anxiety disorder)   [facility], Marzella Schlein, MD   1 year ago Attention deficit disorder (ADD) without  hyperactivity   [facility], Marzella Schlein, MD      Future Appointments            In 3 weeks Bacigalupo, Marzella Schlein, MD [facility]   In 3 months Bacigalupo, Marzella Schlein, MD [facility], [facility]

## 2020-02-08 NOTE — Telephone Encounter (Signed)
Medication Refill - Medication: gabapentin, ritalin, citalopram   Has the patient contacted their pharmacy? Yes.   (Agent: If no, request that the patient contact the pharmacy for the refill.) (Agent: If yes, when and what did the pharmacy advise?)  Preferred Pharmacy (with phone number or street name):  [facility] - [place], [place] - [address]  [address] [place] [address]  Phone: [phone] Fax: [phone]  Hours: Not open 24 hours     Agent: Please be advised that RX refills may take up to 3 business days. We ask that you follow-up with your pharmacy.

## 2020-02-08 NOTE — Telephone Encounter (Signed)
Requested medication (s) are due for refill today:yes  Requested medication (s) are on the active medication list: yes  Last refill: 10/22/19  #60  0 refills  Future visit scheduled: yes  Notes to clinic: Not delegated    Requested Prescriptions  Pending Prescriptions Disp Refills   methylphenidate (RITALIN) 5 MG tablet 60 tablet 0    Sig: Take 1 tablet (5 mg total) by mouth 2 (two) times daily.      Not Delegated - Psychiatry:  Stimulants/ADHD Failed - 02/08/2020 12:01 PM      Failed - This refill cannot be delegated      Failed - Urine Drug Screen completed in last 360 days.      Passed - Valid encounter within last 3 months    Recent Outpatient Visits           1 month ago Alcohol abuse   [facility] Os[facility] Angst M, [place]   5 months ago Attention deficit disorder (ADD) without hyperactivity   [facility], Marzella Schlein, MD   1 year ago Scabies   [facility] [place], Marzella Schlein, MD   1 year ago GAD (generalized anxiety disorder)   [facility], Marzella Schlein, MD   1 year ago Attention deficit disorder (ADD) without hyperactivity   [facility] [place], Marzella Schlein, MD       Future Appointments             In 3 weeks Bacigalupo, Marzella Schlein, MD [facility]   In 3 months Bacigalupo, Marzella Schlein, MD [facility], [facility]             Signed Prescriptions Disp Refills   citalopram (CELEXA) 40 MG tablet 90 tablet 0    Sig: citalopram 40 mg tablet      Psychiatry:  Antidepressants - SSRI Failed - 02/08/2020 12:01 PM      Failed - Completed PHQ-2 or PHQ-9 in the last 360 days.      Passed - Valid encounter within last 6 months    Recent Outpatient Visits           1 month ago Alcohol abuse   [facility] Osvaldo Angst M, [place]   5 months ago Attention deficit disorder (ADD) without hyperactivity   [facility],  Marzella Schlein, MD   1 year ago Scabies   [facility] Bac[place], Marzella Schlein, MD   1 year ago GAD (generalized anxiety disorder)   [facility], Marzella Schlein, MD   1 year ago Attention deficit disorder (ADD) without hyperactivity   [facility] Bac[place]al[place], Marzella Schlein, MD       Future Appointments             In 3 weeks Bacigalupo, Marzella Schlein, MD [facility]   In 3 months Bacigalupo, Marzella Schlein, MD [facility]              gabapentin (NEURONTIN) 300 MG capsule 90 capsule 0    Sig: Take 1 capsule (300 mg total) by mouth 3 (three) times daily.      Neurology: Anticonvulsants - gabapentin Passed - 02/08/2020 12:01 PM      Passed - Valid encounter within last 12 months    Recent Outpatient Visits           1 month ago Alcohol abuse   [facility] [place], [place], [place]  5 months ago Attention deficit disorder (ADD) without hyperactivity   [facility], Marzella Schlein, MD   1 year ago Scabies   [facility] [place], Marzella Schlein, MD   1 year ago GAD (generalized anxiety disorder)   [facility], Marzella Schlein, MD   1 year ago Attention deficit disorder (ADD) without hyperactivity   [facility] [place]al[place], Marzella Schlein, MD       Future Appointments             In 3 weeks Bacigalupo, Marzella Schlein, MD [facility]   In 3 months Bacigalupo, Marzella Schlein, MD [facility]

## 2020-02-09 MED ORDER — METHYLPHENIDATE HCL 5 MG PO TABS: 5.0000 mg | ORAL_TABLET | Freq: Two times a day (BID) | ORAL | 0 refills | Status: DC

## 2020-02-13 ENCOUNTER — Encounter: Payer: Self-pay | Admitting: Family Medicine

## 2020-02-13 ENCOUNTER — Other Ambulatory Visit: Payer: Self-pay

## 2020-02-13 ENCOUNTER — Telehealth (INDEPENDENT_AMBULATORY_CARE_PROVIDER_SITE_OTHER): Payer: 59 | Admitting: Family Medicine

## 2020-02-13 DIAGNOSIS — L308 Other specified dermatitis: Secondary | ICD-10-CM

## 2020-02-13 DIAGNOSIS — B028 Zoster with other complications: Secondary | ICD-10-CM | POA: Diagnosis not present

## 2020-02-13 MED ORDER — VALACYCLOVIR HCL 1 G PO TABS: 1000.0000 mg | ORAL_TABLET | Freq: Two times a day (BID) | ORAL | 0 refills | Status: DC

## 2020-02-13 NOTE — Progress Notes (Signed)
MyChart Video Visit    Virtual Visit via Video Note   This visit type was conducted due to national recommendations for restrictions regarding the COVID-19 Pandemic (e.g. social distancing) in an effort to limit this patient's exposure and mitigate transmission in our community. This patient is at least at moderate risk for complications without adequate follow up. This format is felt to be most appropriate for this patient at this time. Physical exam was limited by quality of the video and audio technology used for the visit.   Patient location: home Provider location: office  I discussed the limitations of evaluation and management by telemedicine and the availability of in person appointments. The patient expressed understanding and agreed to proceed.  Patient: Dawn Martinez   DOB: 08/02/1990   29 y.o. Female  MRN: [id number] Visit Date: 02/13/2020  Today's healthcare provider: Dortha Kern, PA   Chief Complaint  Patient presents with  . Rash   Subjective    HPI  Patient is a 29 year old female who presents via video visit for rash.  She states she has had the rash for about 4-5 days.  It is on her neck and back, mainly on the right side.  It is blistery, itchy and burns.   Her 4 month old daughter was diagnosed this morning with hand-foot-mouth disease.   The patient denies any fever.  Past Medical History:  Diagnosis Date  . ADHD (attention deficit hyperactivity disorder)   . Anxiety    Past Surgical History:  Procedure Laterality Date  . APPENDECTOMY     Social History   Tobacco Use  . Smoking status: Former Smoker    Quit date: 11/18/2014    Years since quitting: 5.2  . Smokeless tobacco: Never Used  Vaping Use  . Vaping Use: Never used  Substance Use Topics  . Alcohol use: No    Alcohol/week: 0.0 standard drinks    Comment: occasional  . Drug use: No   Family Status  Relation Name Status  . Mother  Alive  . Brother  Alive  . Brother 1/2  brother Alive  . Sister 1/2 sister Alive  . Father  Deceased   Allergies  Allergen Reactions  . Cefaclor Hives  . Duloxetine Nausea Only and Other (See Comments)    G.I. upset  . Promethazine Hcl Other (See Comments)    hallucinations      Medications: Outpatient Medications Prior to Visit  Medication Sig  . acetaminophen (TYLENOL) 500 MG tablet Take 1,000 mg by mouth every 6 (six) hours as needed for mild pain or headache.   . albuterol (VENTOLIN HFA) 108 (90 Base) MCG/ACT inhaler Inhale 2 puffs into the lungs 2 (two) times daily as needed for wheezing or shortness of breath. (Patient not taking: Reported on 12/22/2019)  . calcium carbonate (TUMS - DOSED IN MG ELEMENTAL CALCIUM) 500 MG chewable tablet Chew 2 tablets by mouth 3 (three) times daily as needed.  (Patient not taking: Reported on 12/22/2019)  . citalopram (CELEXA) 40 MG tablet citalopram 40 mg tablet  . dicyclomine (BENTYL) 10 MG capsule Take 1 capsule (10 mg total) by mouth 3 (three) times daily before meals. (Patient not taking: Reported on 12/22/2019)  . ferrous sulfate 325 (65 FE) MG tablet Take 325 mg by mouth daily with breakfast. (Patient not taking: Reported on 12/22/2019)  . fluticasone (FLONASE) 50 MCG/ACT nasal spray Place 2 sprays into both nostrils daily.  Marland Kitchen gabapentin (NEURONTIN) 300 MG capsule Take 1  capsule (300 mg total) by mouth 3 (three) times daily.  Marland Kitchen ibuprofen (ADVIL) 600 MG tablet Take 1 tablet (600 mg total) by mouth every 6 (six) hours.  . methylphenidate (RITALIN) 5 MG tablet Take 1 tablet (5 mg total) by mouth 2 (two) times daily.  . methylphenidate (RITALIN) 5 MG tablet Take 1 tablet (5 mg total) by mouth 2 (two) times daily.  . methylphenidate (RITALIN) 5 MG tablet Take 1 tablet (5 mg total) by mouth 2 (two) times daily.  . nicotine (NICODERM CQ) 14 mg/24hr patch Place 1 patch (14 mg total) onto the skin daily.  Marland Kitchen omeprazole (PRILOSEC) 20 MG capsule Take 20 mg by mouth daily. (Patient not taking:  Reported on 12/22/2019)  . Prenatal Vit-Fe Fumarate-FA (PRENATAL MULTIVITAMIN) TABS tablet Take 1 tablet by mouth daily at 12 noon. (Patient not taking: Reported on 12/22/2019)   No facility-administered medications prior to visit.    Review of Systems    Objective    There were no vitals taken for this visit.   Physical Exam : WDWN female in no apparent distress.  Head: Normocephalic, atraumatic. Neck: Supple, NROM Respiratory: No apparent distress Psych: Normal mood and affect Skin: Tender cluster of vesicles with slight erythema on the right shoulder.   Assessment & Plan     1. Herpes zoster dermatitis Cluster of tender vesicular rash on the right anterior shoulder started over the past 4-5 days. Will treat with Valtrex and keep lesions covered well with frequent handwashing to handle child. Recheck prn and counsel with pediatrician prn. - valACYclovir (VALTREX) 1000 MG tablet; Take 1 tablet (1,000 mg total) by mouth 2 (two) times daily.  Dispense: 20 tablet; Refill: 0   No follow-ups on file.     I discussed the assessment and treatment plan with the patient. The patient was provided an opportunity to ask questions and all were answered. The patient agreed with the plan and demonstrated an understanding of the instructions.   The patient was advised to call back or seek an in-person evaluation if the symptoms worsen or if the condition fails to improve as anticipated.  I provided 10 minutes of non-face-to-face time during this encounter.  Haywood Pao, PA, have reviewed all documentation for this visit. The documentation on 02/13/20 for the exam, diagnosis, procedures, and orders are all accurate and complete.   Dortha Kern, PA [facility] [phone] (phone) [phone] (fax)  [facility]

## 2020-03-01 ENCOUNTER — Ambulatory Visit: Payer: Self-pay | Admitting: Family Medicine

## 2020-03-09 ENCOUNTER — Other Ambulatory Visit: Payer: Self-pay | Admitting: Physician Assistant

## 2020-03-09 DIAGNOSIS — F1010 Alcohol abuse, uncomplicated: Secondary | ICD-10-CM

## 2020-03-23 ENCOUNTER — Other Ambulatory Visit: Payer: Self-pay | Admitting: Family Medicine

## 2020-03-23 NOTE — Telephone Encounter (Signed)
Patient requested methylphenidate (RITALIN) 5 MG tablet on 03/11/2020 from her pharmacy, patient would like request expedited, please advise      [facility], [place]. - [place], [place] - [address] Phone:  [phone]  Fax:  [phone]

## 2020-03-26 ENCOUNTER — Telehealth (INDEPENDENT_AMBULATORY_CARE_PROVIDER_SITE_OTHER): Payer: Self-pay | Admitting: Family Medicine

## 2020-03-26 ENCOUNTER — Other Ambulatory Visit: Payer: Self-pay | Admitting: Family Medicine

## 2020-03-26 ENCOUNTER — Encounter: Payer: Self-pay | Admitting: Family Medicine

## 2020-03-26 DIAGNOSIS — F988 Other specified behavioral and emotional disorders with onset usually occurring in childhood and adolescence: Secondary | ICD-10-CM

## 2020-03-26 DIAGNOSIS — F1010 Alcohol abuse, uncomplicated: Secondary | ICD-10-CM

## 2020-03-26 DIAGNOSIS — F331 Major depressive disorder, recurrent, moderate: Secondary | ICD-10-CM

## 2020-03-26 DIAGNOSIS — R5382 Chronic fatigue, unspecified: Secondary | ICD-10-CM

## 2020-03-26 DIAGNOSIS — F411 Generalized anxiety disorder: Secondary | ICD-10-CM

## 2020-03-26 MED ORDER — METHYLPHENIDATE HCL 5 MG PO TABS: 5.0000 mg | ORAL_TABLET | Freq: Two times a day (BID) | ORAL | 0 refills | Status: DC

## 2020-03-26 NOTE — Telephone Encounter (Signed)
Overdue for f/u appt for this and alcohol use disorder as seen by Ricki Rodriguez in 12/2019.  May be able to squeeze virtual visit in today

## 2020-03-26 NOTE — Assessment & Plan Note (Signed)
Chronic and fairly well controlled Still undergoing a lot of stress Continue therapy Continue celexa at current dose

## 2020-03-26 NOTE — Assessment & Plan Note (Signed)
Chronic and fairly well controlled Still undergoing a lot of stress Continue therapy Continue celexa at current dose 

## 2020-03-26 NOTE — Telephone Encounter (Signed)
Please review. Thanks!  

## 2020-03-26 NOTE — Telephone Encounter (Signed)
[facility] faxed refill request for the following medications:  methylphenidate (RITALIN) 5 MG tablet - needing a new Rx to refill.    Please advise. Thanks, [employer]

## 2020-03-26 NOTE — Assessment & Plan Note (Signed)
Sober for almost 2 months Is going to stay away from alcohol Continue gabapentin qhs

## 2020-03-26 NOTE — Assessment & Plan Note (Signed)
Chronic and fairly well controlled Continue Ritalin BID Refills x3 sent in

## 2020-03-26 NOTE — Progress Notes (Signed)
MyChart Video Visit    Virtual Visit via Video Note   This visit type was conducted due to national recommendations for restrictions regarding the COVID-19 Pandemic (e.g. social distancing) in an effort to limit this patient's exposure and mitigate transmission in our community. This patient is at least at moderate risk for complications without adequate follow up. This format is felt to be most appropriate for this patient at this time. Physical exam was limited by quality of the video and audio technology used for the visit.    Patient location: home Provider location: [facility] Persons involved in the visit: patient, provider  I discussed the limitations of evaluation and management by telemedicine and the availability of in person appointments. The patient expressed understanding and agreed to proceed.  Patient: Dawn Martinez   DOB: Sep 07, 1990   28 y.o. Female  MRN: [id number] Visit Date: 03/26/2020  Today's healthcare provider: Shirlee Latch, MD   Chief Complaint  Patient presents with  . ADHD  . Depression   Subjective    HPI   Has been out of Ritalin x2 weeks.  Notices a difference being off of it.  After pregnancy, postpartum depression worsened and Celexa was increased.  Started drinking alcohol to cope.  Felt like she was unable to quit.  Depression is doing better.  Separating from husband.  Sober for over 1 month.  Gabapentin has been very helpful.  Depression screen [facility] 2/9 03/26/2020 12/10/2018 11/04/2017 09/21/2017  Decreased Interest 0 0 1 3  Down, Depressed, Hopeless 0 2 1 3   PHQ - 2 Score 0 2 2 6   Altered sleeping 0 1 2 2   Tired, decreased energy 3 3 3 3   Change in appetite 1 0 1 2  Feeling bad or failure about yourself  1 3 1 3   Trouble concentrating 3 2 2 2   Moving slowly or fidgety/restless 0 0 1 1  Suicidal thoughts 0 2 0 1  PHQ-9 Score 8 13 12 20   Difficult doing work/chores Somewhat difficult Extremely dIfficult Somewhat  difficult Extremely dIfficult     Social History   Tobacco Use  . Smoking status: Former Smoker    Quit date: 11/18/2014    Years since quitting: 5.3  . Smokeless tobacco: Never Used  Vaping Use  . Vaping Use: Never used  Substance Use Topics  . Alcohol use: No    Alcohol/week: 0.0 standard drinks    Comment: occasional  . Drug use: No      Medications: Outpatient Medications Prior to Visit  Medication Sig  . acetaminophen (TYLENOL) 500 MG tablet Take 1,000 mg by mouth every 6 (six) hours as needed for mild pain or headache.   . citalopram (CELEXA) 40 MG tablet citalopram 40 mg tablet  . fluticasone (FLONASE) 50 MCG/ACT nasal spray Place 2 sprays into both nostrils daily.  gabapentin (NEURONTIN) 300 MG capsule TAKE (1) CAPSULE BY MOUTH THREE TIMES DAILY  . ibuprofen (ADVIL) 600 MG tablet Take 1 tablet (600 mg total) by mouth every 6 (six) hours.  . nicotine (NICODERM CQ) 14 mg/24hr patch Place 1 patch (14 mg total) onto the skin daily.  . valACYclovir (VALTREX) 1000 MG tablet Take 1 tablet (1,000 mg total) by mouth 2 (two) times daily.  . [DISCONTINUED] albuterol (VENTOLIN HFA) 108 (90 Base) MCG/ACT inhaler Inhale 2 puffs into the lungs 2 (two) times daily as needed for wheezing or shortness of breath. (Patient not taking: Reported on 12/22/2019)  . [DISCONTINUED] calcium  carbonate (TUMS - DOSED IN MG ELEMENTAL CALCIUM) 500 MG chewable tablet Chew 2 tablets by mouth 3 (three) times daily as needed.  (Patient not taking: Reported on 12/22/2019)  . [DISCONTINUED] dicyclomine (BENTYL) 10 MG capsule Take 1 capsule (10 mg total) by mouth 3 (three) times daily before meals. (Patient not taking: Reported on 12/22/2019)  . [DISCONTINUED] ferrous sulfate 325 (65 FE) MG tablet Take 325 mg by mouth daily with breakfast. (Patient not taking: Reported on 12/22/2019)  . [DISCONTINUED] methylphenidate (RITALIN) 5 MG tablet Take 1 tablet (5 mg total) by mouth 2 (two) times daily.  . [DISCONTINUED]  methylphenidate (RITALIN) 5 MG tablet Take 1 tablet (5 mg total) by mouth 2 (two) times daily.  . [DISCONTINUED] methylphenidate (RITALIN) 5 MG tablet Take 1 tablet (5 mg total) by mouth 2 (two) times daily.  . [DISCONTINUED] omeprazole (PRILOSEC) 20 MG capsule Take 20 mg by mouth daily. (Patient not taking: Reported on 12/22/2019)  . [DISCONTINUED] Prenatal Vit-Fe Fumarate-FA (PRENATAL MULTIVITAMIN) TABS tablet Take 1 tablet by mouth daily at 12 noon. (Patient not taking: Reported on 12/22/2019)   No facility-administered medications prior to visit.    Review of Systems  Constitutional: Positive for fatigue. Negative for activity change, appetite change, chills, diaphoresis, fever and unexpected weight change.  Respiratory: Negative.   Cardiovascular: Negative.   Neurological: Negative.   Psychiatric/Behavioral: Negative.       Objective    There were no vitals taken for this visit.   Physical Exam Constitutional:      General: She is not in acute distress.    Appearance: Normal appearance. She is not diaphoretic.  HENT:     Head: Normocephalic and atraumatic.  Pulmonary:     Effort: Pulmonary effort is normal. No respiratory distress.  Neurological:     Mental Status: She is alert and oriented to person, place, and time. Mental status is at baseline.  Psychiatric:        Mood and Affect: Mood normal.        Behavior: Behavior normal.        Assessment & Plan     Problem List Items Addressed This Visit      Other   GAD (generalized anxiety disorder)    Chronic and fairly well controlled Still undergoing a lot of stress Continue therapy Continue celexa at current dose      MDD (major depressive disorder)    Chronic and fairly well controlled Still undergoing a lot of stress Continue therapy Continue celexa at current dose      ADD (attention deficit disorder) - Primary    Chronic and fairly well controlled Continue Ritalin BID Refills x3 sent in       Alcohol abuse    Sober for almost 2 months Is going to stay away from alcohol Continue gabapentin qhs       Other Visit Diagnoses    Chronic fatigue       Relevant Orders   TSH   CBC       Return in about 3 months (around 06/26/2020) for CPE, as scheduled.     I discussed the assessment and treatment plan with the patient. The patient was provided an opportunity to ask questions and all were answered. The patient agreed with the plan and demonstrated an understanding of the instructions.   The patient was advised to call back or seek an in-person evaluation if the symptoms worsen or if the condition fails to improve as anticipated.  I, Shirlee Latch, MD, have reviewed all documentation for this visit. The documentation on 03/26/20 for the exam, diagnosis, procedures, and orders are all accurate and complete.   Brenley Priore, Marzella Schlein, MD, MPH [facility]

## 2020-03-27 ENCOUNTER — Telehealth: Payer: Self-pay

## 2020-03-27 NOTE — Telephone Encounter (Signed)
I don't see who called this pt.  If she calls back please see who called her.    Thanks,   Vernona Rieger    Copied from CRM [id number]. Topic: General - Other >> Mar 26, 2020  2:37 PM Jaquita Rector A wrote: Reason for CRM: Patient returned call to the nurse can be reached at Ph# [phone]

## 2020-04-27 ENCOUNTER — Other Ambulatory Visit: Payer: Self-pay | Admitting: Family Medicine

## 2020-04-27 ENCOUNTER — Encounter: Payer: Self-pay | Admitting: Family Medicine

## 2020-04-27 DIAGNOSIS — F411 Generalized anxiety disorder: Secondary | ICD-10-CM

## 2020-04-27 DIAGNOSIS — F1010 Alcohol abuse, uncomplicated: Secondary | ICD-10-CM

## 2020-04-30 MED ORDER — CITALOPRAM HYDROBROMIDE 40 MG PO TABS: ORAL_TABLET | ORAL | 0 refills | Status: DC

## 2020-04-30 MED ORDER — METHYLPHENIDATE HCL 5 MG PO TABS
5.0000 mg | ORAL_TABLET | Freq: Two times a day (BID) | ORAL | 0 refills | Status: DC
Start: 2020-04-30 — End: 2020-08-09

## 2020-04-30 MED ORDER — GABAPENTIN 300 MG PO CAPS: ORAL_CAPSULE | ORAL | 5 refills | Status: DC

## 2020-05-02 ENCOUNTER — Telehealth: Payer: Self-pay

## 2020-05-02 NOTE — Telephone Encounter (Signed)
Mychart visit scheduled for tomorrow (12/23) @ 10am.

## 2020-05-02 NOTE — Telephone Encounter (Signed)
Copied from CRM [id number]. Topic: General - Other >> May 02, 2020  1:19 PM Jaquita Rector A wrote: Reason for CRM: Patient called in to get an appointment has been sick for over a week with cough and chest congestion. Asking if there is anything that can be done for her today please or a Zpack sent to the pharmacy for her. Please call Ph#  [phone]

## 2020-05-02 NOTE — Progress Notes (Signed)
MyChart Video Visit    Virtual Visit via Video Note   This visit type was conducted due to national recommendations for restrictions regarding the COVID-19 Pandemic (e.g. social distancing) in an effort to limit this patient's exposure and mitigate transmission in our community. This patient is at least at moderate risk for complications without adequate follow up. This format is felt to be most appropriate for this patient at this time. Physical exam was limited by quality of the video and audio technology used for the visit.   Interactive audio and video communications were attempted, although failed due to patient's inability to connect to video. Continued visit with audio only interaction with patient agreement.  Patient location: Home Provider location: [facility]  I discussed the limitations of evaluation and management by telemedicine and the availability of in person appointments. The patient expressed understanding and agreed to proceed.  Patient: Dawn Martinez   DOB: 06-23-1990   28 y.o. Female  MRN: [id number] Visit Date: 05/03/2020  Today's healthcare provider: Margaretann Loveless, PA-C   Chief Complaint  Patient presents with  . Nasal Congestion    Cough, chest congestion, afebrile x 1 week. Pt tested negative for Flu A/B and negative for covid via a PCR send out.   Subjective    HPI HPI    Nasal Congestion     Additional comments: Cough, chest congestion, afebrile x 1 week. Pt tested negative for Flu A/B and negative for covid via a PCR send out.       Last edited by Paticia Stack, CMA on 05/03/2020  9:41 AM. (History)      Symptoms started last Thursday. Started with head congestion, sinus pressure. Unable to work last Friday. No fever. Mild cough. Starting to become productive, greenish mucus. Has tried Mucinex, Sudafed sinus, no relief. No sore throat. Some nausea. No vomiting. No SOB. Some rattling, no wheezing. Had Covid 19 in January 2021.  Patient  Active Problem List   Diagnosis Date Noted  . Alcohol abuse 03/26/2020  . IBS (irritable bowel syndrome) 02/02/2017  . ADD (attention deficit disorder) 02/14/2015  . ASCUS favoring benign 11/03/2014  . GAD (generalized anxiety disorder) 11/03/2014  . MDD (major depressive disorder) 11/03/2014  . 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 Allergic rhinitis, seasonal 11/03/2014   Past Medical History:  Diagnosis Date  . ADHD (attention deficit hyperactivity disorder)   . Anxiety       Medications: Outpatient Medications Prior to Visit  Medication Sig  . acetaminophen (TYLENOL) 500 MG tablet Take 1,000 mg by mouth every 6 (six) hours as needed for mild pain or headache.   . citalopram (CELEXA) 40 MG tablet citalopram 40 mg tablet  . fluticasone (FLONASE) 50 MCG/ACT nasal spray Place 2 sprays into both nostrils daily.  Marland Kitchen gabapentin (NEURONTIN) 300 MG capsule TAKE (1) CAPSULE BY MOUTH THREE TIMES DAILY  . ibuprofen (ADVIL) 600 MG tablet Take 1 tablet (600 mg total) by mouth every 6 (six) hours.  . methylphenidate (RITALIN) 5 MG tablet Take 1 tablet (5 mg total) by mouth 2 (two) times daily.  . methylphenidate (RITALIN) 5 MG tablet Take 1 tablet (5 mg total) by mouth 2 (two) times daily.  . methylphenidate (RITALIN) 5 MG tablet Take 1 tablet (5 mg total) by mouth 2 (two) times daily.  . nicotine (NICODERM CQ) 14 mg/24hr patch Place 1 patch (14 mg total) onto the skin daily.  . valACYclovir (VALTREX) 1000 MG tablet Take 1 tablet (  1,000 mg total) by mouth 2 (two) times daily.   No facility-administered medications prior to visit.    Review of Systems  Constitutional: Negative for fatigue and fever.  HENT: Positive for congestion, hearing loss, rhinorrhea, sinus pressure and sinus pain. Negative for sore throat.        Pt says she just feels very congested.  Respiratory: Positive for cough. Negative for apnea, choking, chest tightness, shortness of breath, wheezing and  stridor.   Neurological: Negative.     Last CBC Lab Results  Component Value Date   WBC 13.9 (H) 06/30/2019   HGB 10.9 (L) 06/30/2019   HCT 32.8 (L) 06/30/2019   MCV 92.1 06/30/2019   MCH 30.6 06/30/2019   RDW 14.7 06/30/2019   PLT 229 06/30/2019   Last metabolic panel Lab Results  Component Value Date   GLUCOSE 117 (H) 05/17/2019   NA 136 05/17/2019   K 3.1 (L) 05/17/2019   CL 104 05/17/2019   CO2 22 05/17/2019   BUN 5 (L) 05/17/2019   CREATININE 0.48 05/17/2019   GFRNONAA >60 05/17/2019   GFRAA >60 05/17/2019   CALCIUM 8.8 (L) 05/17/2019   PROT 5.7 (L) 05/15/2019   ALBUMIN 2.6 (L) 05/15/2019   LABGLOB 2.4 11/04/2017   AGRATIO 2.0 11/04/2017   BILITOT 0.3 05/15/2019   ALKPHOS 90 05/15/2019   AST 31 05/15/2019   ALT 33 05/15/2019   ANIONGAP 10 05/17/2019      Objective    There were no vitals taken for this visit. BP Readings from Last 3 Encounters:  12/22/19 122/78  07/01/19 116/68  05/17/19 104/67   Wt Readings from Last 3 Encounters:  12/22/19 173 lb (78.5 kg)  06/29/19 187 lb 4.8 oz (85 kg)  05/17/19 180 lb (81.6 kg)      Physical Exam Vitals reviewed.  Constitutional:      Appearance: Normal appearance. She is well-developed and well-nourished.  HENT:     Head: Normocephalic and atraumatic.  Eyes:     Extraocular Movements: EOM normal.  Pulmonary:     Effort: Pulmonary effort is normal. No respiratory distress.  Musculoskeletal:     Cervical back: Normal range of motion and neck supple.  Neurological:     Mental Status: She is alert.  Psychiatric:        Mood and Affect: Mood and affect and mood normal.        Behavior: Behavior normal.        Thought Content: Thought content normal.        Judgment: Judgment normal.        Assessment & Plan     1. Acute non-recurrent pansinusitis Worsening symptoms that have not responded to OTC medications. Will give azithromycin and tessalon perles as below. Continue allergy medications. Stay  well hydrated and get plenty of rest. Call if no symptom improvement or if symptoms worsen. - benzonatate (TESSALON) 200 MG capsule; Take 1 capsule (200 mg total) by mouth 3 (three) times daily as needed.  Dispense: 30 capsule; Refill: 0 - azithromycin (ZITHROMAX) 250 MG tablet; Take 2 tablets PO on day one, and one tablet PO daily thereafter until completed.  Dispense: 6 tablet; Refill: 0   No follow-ups on file.     I discussed the assessment and treatment plan with the patient. The patient was provided an opportunity to ask questions and all were answered. The patient agreed with the plan and demonstrated an understanding of the instructions.   The patient was advised  to call back or seek an in-person evaluation if the symptoms worsen or if the condition fails to improve as anticipated.  I provided 14 minutes of face-to-face time during this encounter via MyChart Video enabled encounter.  Delmer Islam, PA-C, have reviewed all documentation for this visit. The documentation on 05/14/20 for the exam, diagnosis, procedures, and orders are all accurate and complete.  Reine Just [facility] [phone] (phone) [phone] (fax)  [facility]

## 2020-05-03 ENCOUNTER — Telehealth (INDEPENDENT_AMBULATORY_CARE_PROVIDER_SITE_OTHER): Payer: 59 | Admitting: Physician Assistant

## 2020-05-03 ENCOUNTER — Other Ambulatory Visit: Payer: Self-pay

## 2020-05-03 DIAGNOSIS — J0140 Acute pansinusitis, unspecified: Secondary | ICD-10-CM | POA: Diagnosis not present

## 2020-05-03 MED ORDER — BENZONATATE 200 MG PO CAPS: 200.0000 mg | ORAL_CAPSULE | Freq: Three times a day (TID) | ORAL | 0 refills | Status: DC | PRN

## 2020-05-03 MED ORDER — AZITHROMYCIN 250 MG PO TABS: ORAL_TABLET | ORAL | 0 refills | Status: DC

## 2020-05-03 NOTE — Patient Instructions (Signed)
Sinusitis, Adult Sinusitis is inflammation of your sinuses. Sinuses are hollow spaces in the bones around your face. Your sinuses are located:  Around your eyes.  In the middle of your forehead.  Behind your nose.  In your cheekbones. Mucus normally drains out of your sinuses. When your nasal tissues become inflamed or swollen, mucus can become trapped or blocked. This allows bacteria, viruses, and fungi to grow, which leads to infection. Most infections of the sinuses are caused by a virus. Sinusitis can develop quickly. It can last for up to 4 weeks (acute) or for more than 12 weeks (chronic). Sinusitis often develops after a cold. What are the causes? This condition is caused by anything that creates swelling in the sinuses or stops mucus from draining. This includes:  Allergies.  Asthma.  Infection from bacteria or viruses.  Deformities or blockages in your nose or sinuses.  Abnormal growths in the nose (nasal polyps).  Pollutants, such as chemicals or irritants in the air.  Infection from fungi (rare). What increases the risk? You are more likely to develop this condition if you:  Have a weak body defense system (immune system).  Do a lot of swimming or diving.  Overuse nasal sprays.  Smoke. What are the signs or symptoms? The main symptoms of this condition are pain and a feeling of pressure around the affected sinuses. Other symptoms include:  Stuffy nose or congestion.  Thick drainage from your nose.  Swelling and warmth over the affected sinuses.  Headache.  Upper toothache.  A cough that may get worse at night.  Extra mucus that collects in the throat or the back of the nose (postnasal drip).  Decreased sense of smell and taste.  Fatigue.  A fever.  Sore throat.  Bad breath. How is this diagnosed? This condition is diagnosed based on:  Your symptoms.  Your medical history.  A physical exam.  Tests to find out if your condition is  acute or chronic. This may include: ? Checking your nose for nasal polyps. ? Viewing your sinuses using a device that has a light (endoscope). ? Testing for allergies or bacteria. ? Imaging tests, such as an MRI or CT scan. In rare cases, a bone biopsy may be done to rule out more serious types of fungal sinus disease. How is this treated? Treatment for sinusitis depends on the cause and whether your condition is chronic or acute.  If caused by a virus, your symptoms should go away on their own within 10 days. You may be given medicines to relieve symptoms. They include: ? Medicines that shrink swollen nasal passages (topical intranasal decongestants). ? Medicines that treat allergies (antihistamines). ? A spray that eases inflammation of the nostrils (topical intranasal corticosteroids). ? Rinses that help get rid of thick mucus in your nose (nasal saline washes).  If caused by bacteria, your health care provider may recommend waiting to see if your symptoms improve. Most bacterial infections will get better without antibiotic medicine. You may be given antibiotics if you have: ? A severe infection. ? A weak immune system.  If caused by narrow nasal passages or nasal polyps, you may need to have surgery. Follow these instructions at home: Medicines  Take, use, or apply over-the-counter and prescription medicines only as told by your health care provider. These may include nasal sprays.  If you were prescribed an antibiotic medicine, take it as told by your health care provider. Do not stop taking the antibiotic even if you start   to feel better. Hydrate and humidify   Drink enough fluid to keep your urine pale yellow. Staying hydrated will help to thin your mucus.  Use a cool mist humidifier to keep the humidity level in your home above 50%.  Inhale steam for 10-15 minutes, 3-4 times a day, or as told by your health care provider. You can do this in the bathroom while a hot shower is  running.  Limit your exposure to cool or dry air. Rest  Rest as much as possible.  Sleep with your head raised (elevated).  Make sure you get enough sleep each night. General instructions   Apply a warm, moist washcloth to your face 3-4 times a day or as told by your health care provider. This will help with discomfort.  Wash your hands often with soap and water to reduce your exposure to germs. If soap and water are not available, use hand sanitizer.  Do not smoke. Avoid being around people who are smoking (secondhand smoke).  Keep all follow-up visits as told by your health care provider. This is important. Contact a health care provider if:  You have a fever.  Your symptoms get worse.  Your symptoms do not improve within 10 days. Get help right away if:  You have a severe headache.  You have persistent vomiting.  You have severe pain or swelling around your face or eyes.  You have vision problems.  You develop confusion.  Your neck is stiff.  You have trouble breathing. Summary  Sinusitis is soreness and inflammation of your sinuses. Sinuses are hollow spaces in the bones around your face.  This condition is caused by nasal tissues that become inflamed or swollen. The swelling traps or blocks the flow of mucus. This allows bacteria, viruses, and fungi to grow, which leads to infection.  If you were prescribed an antibiotic medicine, take it as told by your health care provider. Do not stop taking the antibiotic even if you start to feel better.  Keep all follow-up visits as told by your health care provider. This is important. This information is not intended to replace advice given to you by your health care provider. Make sure you discuss any questions you have with your health care provider. Document Revised: 09/28/2017 Document Reviewed: 09/28/2017 Elsevier Patient Education  2020 [employer]  

## 2020-05-14 ENCOUNTER — Encounter: Payer: Self-pay | Admitting: Physician Assistant

## 2020-05-15 ENCOUNTER — Encounter: Payer: Self-pay | Admitting: Family Medicine

## 2020-06-06 ENCOUNTER — Encounter: Payer: Self-pay | Admitting: Family Medicine

## 2020-06-07 MED ORDER — METHYLPHENIDATE HCL 5 MG PO TABS
5.0000 mg | ORAL_TABLET | Freq: Two times a day (BID) | ORAL | 0 refills | Status: DC
Start: 2020-06-07 — End: 2020-07-10

## 2020-07-02 ENCOUNTER — Telehealth (INDEPENDENT_AMBULATORY_CARE_PROVIDER_SITE_OTHER): Payer: 59 | Admitting: Family Medicine

## 2020-07-02 ENCOUNTER — Encounter: Payer: Self-pay | Admitting: Family Medicine

## 2020-07-02 DIAGNOSIS — J069 Acute upper respiratory infection, unspecified: Secondary | ICD-10-CM | POA: Diagnosis not present

## 2020-07-02 NOTE — Patient Instructions (Signed)

## 2020-07-02 NOTE — Progress Notes (Signed)
MyChart Video Visit    Virtual Visit via Video Note   This visit type was conducted due to national recommendations for restrictions regarding the COVID-19 Pandemic (e.g. social distancing) in an effort to limit this patient's exposure and mitigate transmission in our community. This patient is at least at moderate risk for complications without adequate follow up. This format is felt to be most appropriate for this patient at this time. Physical exam was limited by quality of the video and audio technology used for the visit.    Patient location: home Provider location: [facility] Persons involved in the visit: patient, provider  I discussed the limitations of evaluation and management by telemedicine and the availability of in person appointments. The patient expressed understanding and agreed to proceed.  Patient: Dawn Martinez   DOB: 1990/11/13   30 y.o. Female  MRN: [id number] Visit Date: 07/02/2020  Today's healthcare provider: Shirlee Latch, MD   Chief Complaint  Patient presents with  . URI   Subjective    HPI  Upper respiratory symptoms She complains of congestion, headache described as frontal and her sinuses are hurt, low grade fever, nasal congestion and productive cough with  green colored sputum.with fever to unknown degrees Fahrenheit. Onset of symptoms was a few days ago and rapidly worsening.She is drinking plenty of fluids.  Past history is significant for occasional episodes of bronchitis. Patient is smoker that just started again 1 year ago.    Symptoms started 2/15. Daughter was tested for COVID due to similar symptoms. [facility] tested both and they were both negative. This was on day 1 of symptoms, so she was retested today ---------------------------------------------------------------------------------------------------     Social History   Tobacco Use  . Smoking status: Former Smoker    Quit date: 11/18/2014     Years since quitting: 5.6  . Smokeless tobacco: Never Used  Vaping Use  . Vaping Use: Never used  Substance Use Topics  . Alcohol use: No    Alcohol/week: 0.0 standard drinks    Comment: occasional  . Drug use: No      Medications: Outpatient Medications Prior to Visit  Medication Sig  . acetaminophen (TYLENOL) 500 MG tablet Take 1,000 mg by mouth every 6 (six) hours as needed for mild pain or headache.   Marland Kitchen azithromycin (ZITHROMAX) 250 MG tablet Take 2 tablets PO on day one, and one tablet PO daily thereafter until completed.  . benzonatate (TESSALON) 200 MG capsule Take 1 capsule (200 mg total) by mouth 3 (three) times daily as needed.  . citalopram (CELEXA) 40 MG tablet citalopram 40 mg tablet  . fluticasone (FLONASE) 50 MCG/ACT nasal spray Place 2 sprays into both nostrils daily.  Marland Kitchen gabapentin (NEURONTIN) 300 MG capsule TAKE (1) CAPSULE BY MOUTH THREE TIMES DAILY  . ibuprofen (ADVIL) 600 MG tablet Take 1 tablet (600 mg total) by mouth every 6 (six) hours.  . methylphenidate (RITALIN) 5 MG tablet Take 1 tablet (5 mg total) by mouth 2 (two) times daily.  . methylphenidate (RITALIN) 5 MG tablet Take 1 tablet (5 mg total) by mouth 2 (two) times daily.  . methylphenidate (RITALIN) 5 MG tablet Take 1 tablet (5 mg total) by mouth 2 (two) times daily.  . nicotine (NICODERM CQ) 14 mg/24hr patch Place 1 patch (14 mg total) onto the skin daily.  . valACYclovir (VALTREX) 1000 MG tablet Take 1 tablet (1,000 mg total) by mouth 2 (two) times daily.   No facility-administered medications prior  to visit.    Review of Systems - per HPI    Objective    There were no vitals taken for this visit.   Physical Exam Constitutional:      General: She is not in acute distress.    Appearance: Normal appearance.  Eyes:     Conjunctiva/sclera: Conjunctivae normal.  Pulmonary:     Effort: Pulmonary effort is normal. No respiratory distress.  Neurological:     Mental Status: She is alert and  oriented to person, place, and time. Mental status is at baseline.  Psychiatric:        Mood and Affect: Mood normal.        Behavior: Behavior normal.        Assessment & Plan     1. Viral URI - symptoms and exam c/w viral URI  - possibly COVID as initial test was done too early in symptom course - test pending from today - no evidence of strep pharyngitis, CAP, AOM, bacterial sinusitis, or other bacterial infection - if sinus pressure persists past day 10 (Thursday), consider abx for possible bacterial process - discussed isolation - discussed symptomatic management with OTC meds, natural course, and return precautions    Return if symptoms worsen or fail to improve.     I discussed the assessment and treatment plan with the patient. The patient was provided an opportunity to ask questions and all were answered. The patient agreed with the plan and demonstrated an understanding of the instructions.   The patient was advised to call back or seek an in-person evaluation if the symptoms worsen or if the condition fails to improve as anticipated.  I, Shirlee Latch, MD, have reviewed all documentation for this visit. The documentation on 07/02/20 for the exam, diagnosis, procedures, and orders are all accurate and complete.   Antwyne Pingree, Marzella Schlein, MD, MPH [facility]

## 2020-07-05 ENCOUNTER — Encounter: Payer: Self-pay | Admitting: Family Medicine

## 2020-07-05 MED ORDER — DOXYCYCLINE HYCLATE 100 MG PO TABS: 100.0000 mg | ORAL_TABLET | Freq: Two times a day (BID) | ORAL | 0 refills | Status: AC

## 2020-07-10 ENCOUNTER — Encounter: Payer: Self-pay | Admitting: Family Medicine

## 2020-07-10 NOTE — Telephone Encounter (Signed)
FYI-send patient a message that she is due for physical a a follow up on her ADD. Didn't see any future appointments.

## 2020-07-11 MED ORDER — METHYLPHENIDATE HCL 5 MG PO TABS
5.0000 mg | ORAL_TABLET | Freq: Two times a day (BID) | ORAL | 0 refills | Status: DC
Start: 2020-07-11 — End: 2020-08-09

## 2020-07-28 ENCOUNTER — Other Ambulatory Visit: Payer: Self-pay | Admitting: Family Medicine

## 2020-07-28 DIAGNOSIS — F411 Generalized anxiety disorder: Secondary | ICD-10-CM

## 2020-07-28 NOTE — Telephone Encounter (Signed)
Requested Prescriptions  Pending Prescriptions Disp Refills  . citalopram (CELEXA) 40 MG tablet [Pharmacy Med Name: CITALOPRAM 40MG  TABLETS] 90 tablet 0    Sig: TAKE 1 TABLET BY MOUTH EVERY DAY     Psychiatry:  Antidepressants - SSRI Passed - 07/28/2020  8:53 AM      Passed - Completed PHQ-2 or PHQ-9 in the last 360 days      Passed - Valid encounter within last 6 months    Recent Outpatient Visits          3 weeks ago Viral URI   [facility] [place], [place], [place]   2 months ago Acute non-recurrent pansinusitis   [facility] M, M   4 months ago Attention deficit disorder (ADD) without hyperactivity   [facility], [facility], MD   5 months ago Herpes zoster dermatitis   [facility] [place], [facility], PA-C   7 months ago Alcohol abuse   [facility] [place], [place], [place]

## 2020-08-09 ENCOUNTER — Telehealth (INDEPENDENT_AMBULATORY_CARE_PROVIDER_SITE_OTHER): Payer: 59 | Admitting: Family Medicine

## 2020-08-09 ENCOUNTER — Encounter: Payer: Self-pay | Admitting: Family Medicine

## 2020-08-09 DIAGNOSIS — F411 Generalized anxiety disorder: Secondary | ICD-10-CM

## 2020-08-09 DIAGNOSIS — F3341 Major depressive disorder, recurrent, in partial remission: Secondary | ICD-10-CM

## 2020-08-09 DIAGNOSIS — F988 Other specified behavioral and emotional disorders with onset usually occurring in childhood and adolescence: Secondary | ICD-10-CM | POA: Diagnosis not present

## 2020-08-09 MED ORDER — METHYLPHENIDATE HCL 5 MG PO TABS: 5.0000 mg | ORAL_TABLET | Freq: Two times a day (BID) | ORAL | 0 refills | Status: DC

## 2020-08-09 NOTE — Assessment & Plan Note (Signed)
Chronic and well controlled Continue ritalin at current dose Refills given x4 months

## 2020-08-09 NOTE — Progress Notes (Signed)
MyChart Video Visit    Virtual Visit via Video Note   This visit type was conducted due to national recommendations for restrictions regarding the COVID-19 Pandemic (e.g. social distancing) in an effort to limit this patient's exposure and mitigate transmission in our community. This patient is at least at moderate risk for complications without adequate follow up. This format is felt to be most appropriate for this patient at this time. Physical exam was limited by quality of the video and audio technology used for the visit.    Patient location: home Provider location: [facility] Persons involved in the visit: patient, provider  I discussed the limitations of evaluation and management by telemedicine and the availability of in person appointments. The patient expressed understanding and agreed to proceed.  Patient: Dawn Martinez   DOB: [birth year]   30 y.o. Female  MRN: [id number] Visit Date: 08/09/2020  Today's healthcare provider: Shirlee Latch, MD   Chief Complaint  Patient presents with  . ADHD   Subjective    HPI   Taking Ritalin with good compliance. No side effects. It is working pretty well.  Mood and anxiety are well controlled on celexa  Social History   Tobacco Use  . Smoking status: Former Smoker    Quit date: 11/18/2014    Years since quitting: 5.7  . Smokeless tobacco: Never Used  Vaping Use  . Vaping Use: Never used  Substance Use Topics  . Alcohol use: No    Alcohol/week: 0.0 standard drinks    Comment: occasional  . Drug use: No      Medications: Outpatient Medications Prior to Visit  Medication Sig  . acetaminophen (TYLENOL) 500 MG tablet Take 1,000 mg by mouth every 6 (six) hours as needed for mild pain or headache.   . citalopram (CELEXA) 40 MG tablet TAKE 1 TABLET BY MOUTH EVERY DAY  . fluticasone (FLONASE) 50 MCG/ACT nasal spray Place 2 sprays into both nostrils daily.  Marland Kitchen gabapentin (NEURONTIN) 300 MG capsule  TAKE (1) CAPSULE BY MOUTH THREE TIMES DAILY  . ibuprofen (ADVIL) 600 MG tablet Take 1 tablet (600 mg total) by mouth every 6 (six) hours.  . [DISCONTINUED] methylphenidate (RITALIN) 5 MG tablet Take 1 tablet (5 mg total) by mouth 2 (two) times daily.  . [DISCONTINUED] methylphenidate (RITALIN) 5 MG tablet Take 1 tablet (5 mg total) by mouth 2 (two) times daily.  . [DISCONTINUED] methylphenidate (RITALIN) 5 MG tablet Take 1 tablet (5 mg total) by mouth 2 (two) times daily.  . [DISCONTINUED] azithromycin (ZITHROMAX) 250 MG tablet Take 2 tablets PO on day one, and one tablet PO daily thereafter until completed. (Patient not taking: Reported on 08/09/2020)  . [DISCONTINUED] benzonatate (TESSALON) 200 MG capsule Take 1 capsule (200 mg total) by mouth 3 (three) times daily as needed. (Patient not taking: Reported on 08/09/2020)  . [DISCONTINUED] nicotine (NICODERM CQ) 14 mg/24hr patch Place 1 patch (14 mg total) onto the skin daily. (Patient not taking: Reported on 08/09/2020)  . [DISCONTINUED] valACYclovir (VALTREX) 1000 MG tablet Take 1 tablet (1,000 mg total) by mouth 2 (two) times daily. (Patient not taking: Reported on 08/09/2020)   No facility-administered medications prior to visit.    Review of Systems  Respiratory: Negative.   Cardiovascular: Negative.   Neurological: Negative.   Psychiatric/Behavioral: Negative.     {Labs  Heme  Chem  Endocrine  Serology  Results Review (optional):[id number]::" "}  Objective    There were no vitals taken for this  visit.   Physical Exam Constitutional:      General: She is not in acute distress.    Appearance: Normal appearance.  HENT:     Head: Normocephalic and atraumatic.  Eyes:     Conjunctiva/sclera: Conjunctivae normal.  Pulmonary:     Effort: Pulmonary effort is normal. No respiratory distress.  Neurological:     Mental Status: She is alert and oriented to person, place, and time. Mental status is at baseline.  Psychiatric:        Mood  and Affect: Mood normal.        Behavior: Behavior normal.      Assessment & Plan     Problem List Items Addressed This Visit      Other   GAD (generalized anxiety disorder)    Chronic and well controlled Continue celexa at current dose cotninue therapy      MDD (major depressive disorder)    Well controlled Continue therapy and celexa at current dose      ADD (attention deficit disorder) - Primary    Chronic and well controlled Continue ritalin at current dose Refills given x4 months          Return in about 4 months (around 12/09/2020) for CPE, as scheduled.     I discussed the assessment and treatment plan with the patient. The patient was provided an opportunity to ask questions and all were answered. The patient agreed with the plan and demonstrated an understanding of the instructions.   The patient was advised to call back or seek an in-person evaluation if the symptoms worsen or if the condition fails to improve as anticipated.  I, Shirlee Latch, MD, have reviewed all documentation for this visit. The documentation on 08/09/20 for the exam, diagnosis, procedures, and orders are all accurate and complete.   Terri Malerba, Marzella Schlein, MD, MPH [facility]

## 2020-08-09 NOTE — Assessment & Plan Note (Signed)
Chronic and well controlled Continue celexa at current dose cotninue therapy

## 2020-08-09 NOTE — Assessment & Plan Note (Signed)
Well controlled Continue therapy and celexa at current dose

## 2020-10-17 ENCOUNTER — Telehealth: Payer: Self-pay | Admitting: Family Medicine

## 2020-10-17 DIAGNOSIS — F411 Generalized anxiety disorder: Secondary | ICD-10-CM

## 2020-10-17 MED ORDER — CITALOPRAM HYDROBROMIDE 40 MG PO TABS: ORAL_TABLET | ORAL | 0 refills | Status: DC

## 2020-10-17 NOTE — Telephone Encounter (Signed)
[facility] faxed refill request for the following medications:  citalopram (CELEXA) 40 MG tablet  Last Rx: 07/28/20 Qty: 90 Refills: 0 LOV: 08/09/20 NOV: 11/29/20 Please advise. Thanks TNP

## 2020-11-28 IMAGING — DX DG CHEST 1V PORT
1 series · 1 of 1 positions shown · non-contrast
Comparison: None.

CLINICAL DATA: Shortness of breath, COVID positive

EXAM:
PORTABLE CHEST 1 VIEW

[chest]
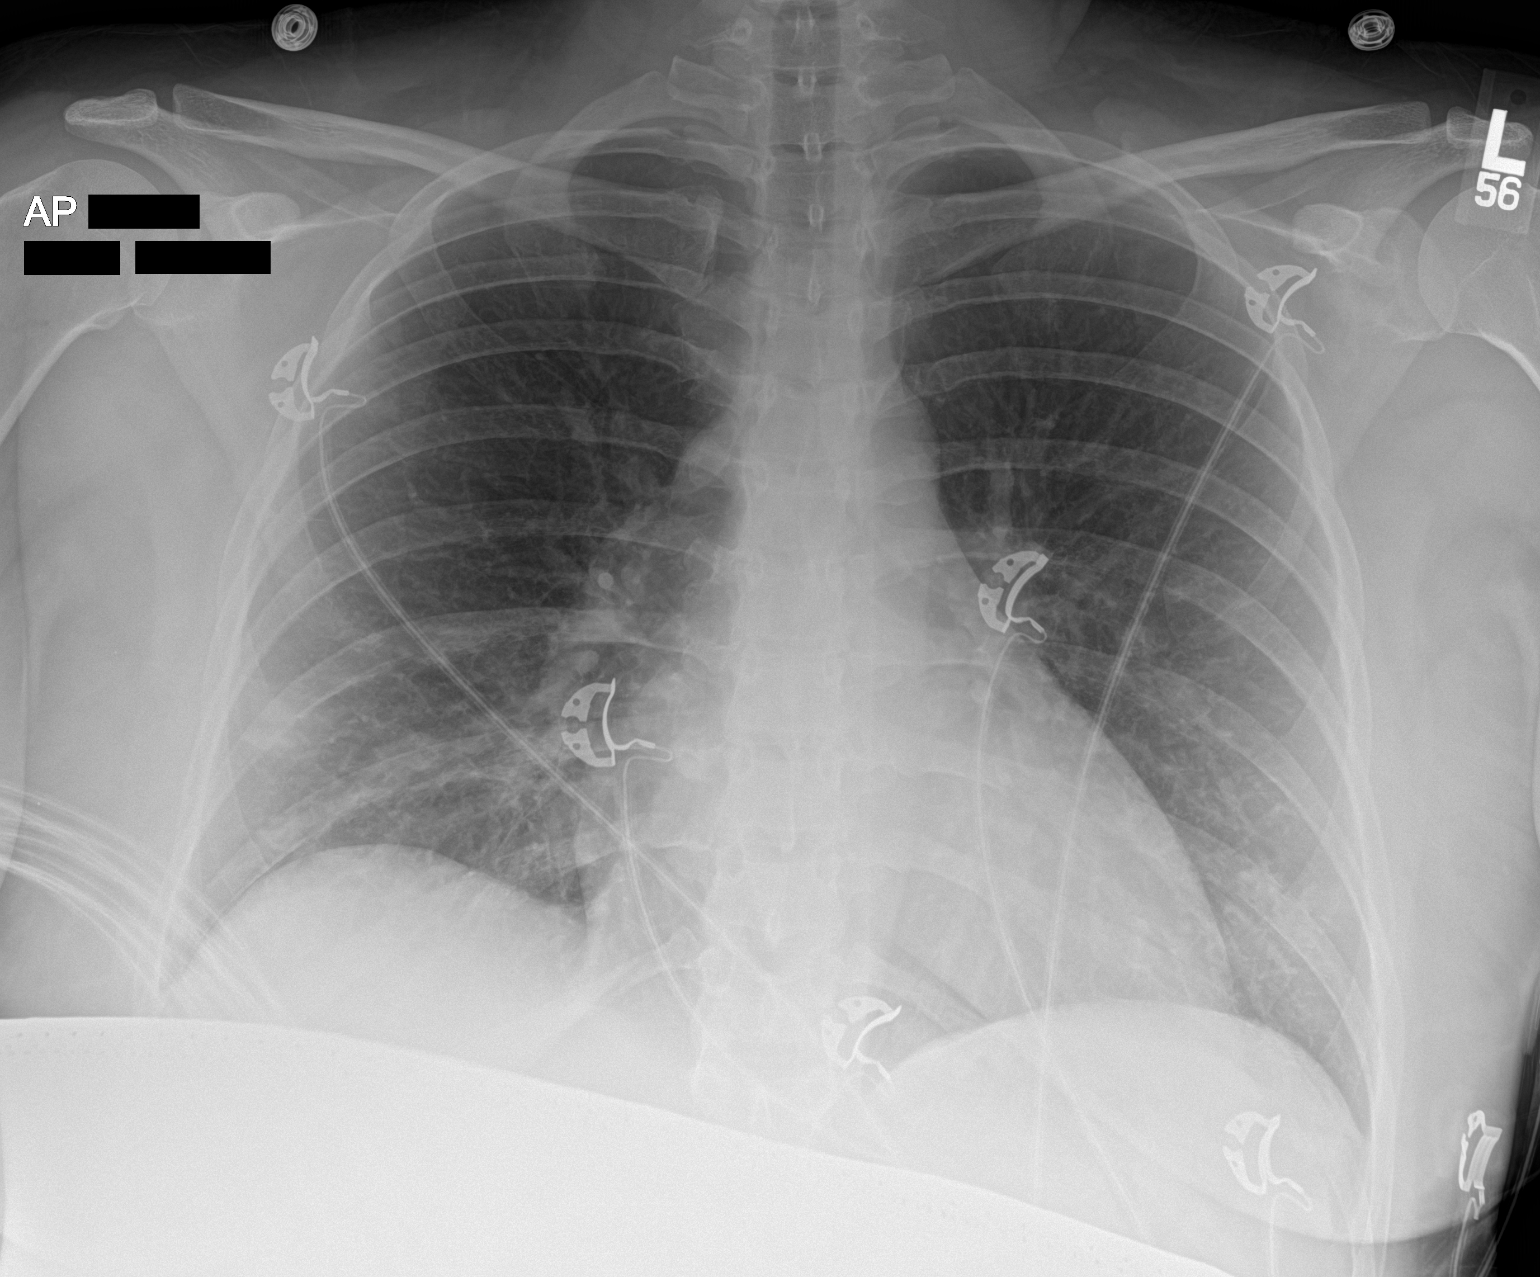

[1 of 1 positions shown; findings below may reference images not displayed]

FINDINGS: The heart size and mediastinal contours are within normal limits.
Subtle heterogeneous airspace opacities of the bilateral lung bases.
The visualized skeletal structures are unremarkable.
IMPRESSION: Subtle heterogeneous airspace opacities of the bilateral lung bases,
consistent with reported [id number].

## 2020-11-29 ENCOUNTER — Ambulatory Visit (INDEPENDENT_AMBULATORY_CARE_PROVIDER_SITE_OTHER): Payer: 59 | Admitting: Family Medicine

## 2020-11-29 ENCOUNTER — Encounter: Payer: Self-pay | Admitting: Family Medicine

## 2020-11-29 ENCOUNTER — Other Ambulatory Visit: Payer: Self-pay

## 2020-11-29 VITALS — BP 112/66 | HR 71 | Temp 97.8°F | Resp 16 | Ht 60.0 in | Wt 154.3 lb

## 2020-11-29 DIAGNOSIS — M2550 Pain in unspecified joint: Secondary | ICD-10-CM | POA: Insufficient documentation

## 2020-11-29 DIAGNOSIS — Z0000 Encounter for general adult medical examination without abnormal findings: Secondary | ICD-10-CM | POA: Diagnosis not present

## 2020-11-29 DIAGNOSIS — D72829 Elevated white blood cell count, unspecified: Secondary | ICD-10-CM | POA: Diagnosis not present

## 2020-11-29 DIAGNOSIS — D649 Anemia, unspecified: Secondary | ICD-10-CM | POA: Diagnosis not present

## 2020-11-29 DIAGNOSIS — Z1159 Encounter for screening for other viral diseases: Secondary | ICD-10-CM

## 2020-11-29 DIAGNOSIS — R5382 Chronic fatigue, unspecified: Secondary | ICD-10-CM | POA: Diagnosis not present

## 2020-11-29 DIAGNOSIS — F411 Generalized anxiety disorder: Secondary | ICD-10-CM | POA: Diagnosis not present

## 2020-11-29 DIAGNOSIS — F988 Other specified behavioral and emotional disorders with onset usually occurring in childhood and adolescence: Secondary | ICD-10-CM | POA: Diagnosis not present

## 2020-11-29 DIAGNOSIS — F3341 Major depressive disorder, recurrent, in partial remission: Secondary | ICD-10-CM | POA: Diagnosis not present

## 2020-11-29 DIAGNOSIS — Z6830 Body mass index (BMI) 30.0-30.9, adult: Secondary | ICD-10-CM

## 2020-11-29 DIAGNOSIS — E669 Obesity, unspecified: Secondary | ICD-10-CM | POA: Insufficient documentation

## 2020-11-29 MED ORDER — METHYLPHENIDATE HCL 5 MG PO TABS: 5.0000 mg | ORAL_TABLET | Freq: Two times a day (BID) | ORAL | 0 refills | Status: DC

## 2020-11-29 MED ORDER — ARIPIPRAZOLE 2 MG PO TABS: 2.0000 mg | ORAL_TABLET | Freq: Every day | ORAL | 3 refills | Status: DC

## 2020-11-29 MED ORDER — CITALOPRAM HYDROBROMIDE 40 MG PO TABS: ORAL_TABLET | ORAL | 0 refills | Status: DC

## 2020-11-29 NOTE — Assessment & Plan Note (Signed)
Chronic and well controlled Continue celexa at current dose Encourage therapy

## 2020-11-29 NOTE — Assessment & Plan Note (Signed)
Chronic fam h/o RA Will check labs Consider Rheum referral

## 2020-11-29 NOTE — Assessment & Plan Note (Signed)
Chronic and well controlled Continue Ritalin BID Refilled x48m - f/u in 27m

## 2020-11-29 NOTE — Assessment & Plan Note (Signed)
Chronic and slightly worsening  Continue celexa at current dose Did not tolerate Wellbutrin in the past Trial of abilify 2mg  daily for augmentation

## 2020-11-29 NOTE — Progress Notes (Signed)
Complete physical exam   Patient: Dawn Martinez   DOB: May 12, 1991   29 y.o. Female  MRN: [id number] Visit Date: 11/29/2020  Today's healthcare provider: [name], MD   Chief Complaint  Patient presents with   Annual Exam   Subjective     HPI  Dawn Martinez is a 30 y.o. female who presents today for a complete physical exam.  She reports consuming a general diet. The patient has a physically strenuous job, but has no regular exercise apart from work.  She generally feels well. She reports sleeping fairly well. She does not have additional problems to discuss today.   Fatigue  She has intermittent fatigue. She has days when she feels full of energy, then extreme fatigue follows. She wants to receive a full panel to check if there are any underlying issues.   Mood/Anxiety Her anxiety is mild and he mood changes in waves. She is tolerating 40 mg celexa. She is interested in additional medication to help improve mood. She has tried welbutrin and was experiencing dry mouth. 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 Her increased drinking habit was associated with postpartum depression and marital strains.   Joint Pain She denies visiting rheumatology and she reports mild joint pain. Her symptoms are predominate in her wrist, fingers and upper shoulder. During pregnancy she had carpal tunnel that has since resolved. From he last mention of her joint pain her pain isn't as severe.   ADHD She is tolerating 5 mg ritalin. When she takes the medication her focus improves.   Screenings Last Reported Pap- 09/02/12 Patient reports that she goes to [facility] and her Dawn Martinez is Dr. Gertie Fey, patient reports last pap 09/2020. Past Medical History:  Diagnosis Date   ADHD (attention deficit hyperactivity disorder)    Anxiety    Past Surgical History:  Procedure Laterality Date   APPENDECTOMY      Social History   Socioeconomic History   Marital status: Legally Separated    Spouse name: Not on file   Number of children: Not on file   Years of education: Not on file   Highest education level: Not on file  Occupational History   Not on file  Tobacco Use   Smoking status: Former    Types: Cigarettes    Quit date: 11/18/2014    Years since quitting: 6.0   Smokeless tobacco: Never  Vaping Use   Vaping Use: Never used  Substance and Sexual Activity   Alcohol use: No    Alcohol/week: 0.0 standard drinks    Comment: occasional   Drug use: No   Sexual activity: Yes    Birth control/protection: None  Other Topics Concern   Not on file  Social History Narrative   Not on file   Social Determinants of Health   Financial Resource Strain: Not on file  Food Insecurity: Not on file  Transportation Needs: Not on file  Physical Activity: Not on file  Stress: Not on file  Social Connections: Not on file  Intimate Partner Violence: Not on file   Family Status  Relation Name Status   Mother  Alive   Brother  Alive   Brother 1/2 brother 38   Sister 1/2 sister Alive   Father  Deceased   Family History  Problem Relation Age of Onset   Depression Mother    Anxiety disorder Mother  Thyroid disease Mother    ADD / ADHD Brother    Diabetes Brother    Gallbladder disease Sister    Endometriosis Sister    Heart disease Father    Diabetes Father    Hypertension Father    Congestive Heart Failure Father    Prostate cancer Father    Leukemia Father    Allergies  Allergen Reactions   Cefaclor Hives   Duloxetine Nausea Only and Other (See Comments)    G.I. upset   Promethazine Hcl Other (See Comments)    hallucinations    Patient Care Team: Virginia Crews, MD as PCP - General (Family Medicine)   Medications: Outpatient Medications Prior to Visit  Medication Sig   acetaminophen (TYLENOL) 500 MG tablet Take 1,000 mg by mouth every 6 (six) hours as needed  for mild pain or headache.    fluticasone (FLONASE) 50 MCG/ACT nasal spray Place 2 sprays into both nostrils daily.   gabapentin (NEURONTIN) 300 MG capsule TAKE (1) CAPSULE BY MOUTH THREE TIMES DAILY   ibuprofen (ADVIL) 600 MG tablet Take 1 tablet (600 mg total) by mouth every 6 (six) hours.   [DISCONTINUED] citalopram (CELEXA) 40 MG tablet TAKE 1 TABLET BY MOUTH EVERY DAY   [DISCONTINUED] methylphenidate (RITALIN) 5 MG tablet Take 1 tablet (5 mg total) by mouth 2 (two) times daily.   [DISCONTINUED] methylphenidate (RITALIN) 5 MG tablet Take 1 tablet (5 mg total) by mouth 2 (two) times daily.   [DISCONTINUED] methylphenidate (RITALIN) 5 MG tablet Take 1 tablet (5 mg total) by mouth 2 (two) times daily.   [DISCONTINUED] methylphenidate (RITALIN) 5 MG tablet Take 1 tablet (5 mg total) by mouth 2 (two) times daily.   No facility-administered medications prior to visit.    Review of Systems  Constitutional:  Positive for fatigue. Negative for chills and fever.  HENT:  Positive for dental problem. Negative for ear pain, sinus pressure, sinus pain and sore throat.   Eyes:  Negative for pain and visual disturbance.  Respiratory:  Negative for cough, chest tightness, shortness of breath and wheezing.   Cardiovascular:  Negative for chest pain, palpitations and leg swelling.  Gastrointestinal:  Negative for abdominal pain, blood in stool, constipation, diarrhea, nausea and vomiting.  Endocrine: Positive for heat intolerance.  Genitourinary:  Negative for flank pain, frequency, pelvic pain and urgency.  Musculoskeletal:  Positive for arthralgias. Negative for back pain, myalgias and neck pain.  Neurological:  Positive for headaches. Negative for dizziness, weakness, light-headedness and numbness.  All other systems reviewed and are negative.    Objective    BP 112/66   Pulse 71   Temp 97.8 F (36.6 C) (Oral)   Resp 16   Ht 5' (1.524 m)   Wt 154 lb 4.8 oz (70 kg)   LMP 11/27/2020 (Exact  Date)   SpO2 99%   BMI 30.13 kg/m    Physical Exam Vitals reviewed.  Constitutional:      General: She is not in acute distress.    Appearance: Normal appearance. She is well-developed. She is not diaphoretic.  HENT:     Head: Normocephalic and atraumatic.  Eyes:     General: No scleral icterus.    Conjunctiva/sclera: Conjunctivae normal.  Neck:     Thyroid: No thyromegaly.  Cardiovascular:     Rate and Rhythm: Normal rate and regular rhythm.     Pulses: Normal pulses.     Heart sounds: Normal heart sounds. No murmur heard. Pulmonary:  Effort: Pulmonary effort is normal. No respiratory distress.     Breath sounds: Normal breath sounds. No wheezing, rhonchi or rales.  Musculoskeletal:     Right shoulder: Normal. Normal range of motion.     Left shoulder: Normal. Normal range of motion.     Right wrist: Normal. Normal range of motion.     Left wrist: Normal. Normal range of motion.     Cervical back: Neck supple.     Right lower leg: No edema.     Left lower leg: No edema.     Comments: Normal range of motion, no fusion   Lymphadenopathy:     Cervical: No cervical adenopathy.  Skin:    General: Skin is warm and dry.     Findings: No rash.  Neurological:     Mental Status: She is alert and oriented to person, place, and time. Mental status is at baseline.  Psychiatric:        Mood and Affect: Mood normal.        Behavior: Behavior normal.      Last depression screening scores PHQ 2/9 Scores 11/29/2020 03/26/2020 12/10/2018  PHQ - 2 Score 0 0 2  PHQ- 9 Score 1 8 13    Last fall risk screening Fall Risk  11/29/2020  Falls in the past year? 0  Number falls in past yr: 0  Injury with Fall? 0   Last Audit-C alcohol use screening Alcohol Use Disorder Test (AUDIT) 11/29/2020  1. How often do you have a drink containing alcohol? 0  2. How many drinks containing alcohol do you have on a typical day when you are drinking? 0  3. How often do you have six or more drinks  on one occasion? 0  AUDIT-C Score 0   A score of 3 or more in women, and 4 or more in men indicates increased risk for alcohol abuse, EXCEPT if all of the points are from question 1   No results found for any visits on 11/29/20.  Assessment & Plan     Problem List Items Addressed This Visit       Other   GAD (generalized anxiety disorder)    Chronic and well controlled Continue celexa at current dose Encourage therapy       Relevant Medications   citalopram (CELEXA) 40 MG tablet   MDD (major depressive disorder)    Chronic and slightly worsening  Continue celexa at current dose Did not tolerate Wellbutrin in the past Trial of abilify 50m daily for augmentation        Relevant Medications   citalopram (CELEXA) 40 MG tablet   ADD (attention deficit disorder)    Chronic and well controlled Continue Ritalin BID Refilled x461m f/u in 74m53m     Polyarthralgia    Chronic fam h/o RA Will check labs Consider Rheum referral       Relevant Orders   Rheumatoid Factor   ANA Direct w/Reflex if Positive   Sed Rate (ESR)   C-reactive protein   Class 1 obesity without serious comorbidity with body mass index (BMI) of 30.0 to 30.9 in adult    Discussed importance of healthy weight management Discussed diet and exercise        Relevant Medications   methylphenidate (RITALIN) 5 MG tablet   methylphenidate (RITALIN) 5 MG tablet   methylphenidate (RITALIN) 5 MG tablet   methylphenidate (RITALIN) 5 MG tablet   Other Relevant Orders   Comprehensive metabolic  panel   Lipid panel   Other Visit Diagnoses     Encounter for annual physical exam    -  Primary   Relevant Orders   Hepatitis C Antibody   Rheumatoid Factor   ANA Direct w/Reflex if Positive   Sed Rate (ESR)   C-reactive protein   CBC w/Diff/Platelet   B12   VITAMIN D 25 Hydroxy (Vit-D Deficiency, Fractures)   TSH   Comprehensive metabolic panel   Lipid panel   Anemia, unspecified type       Relevant  Orders   CBC w/Diff/Platelet   Leukocytosis, unspecified type       Relevant Orders   CBC w/Diff/Platelet   Chronic fatigue       Relevant Orders   CBC w/Diff/Platelet   B12   VITAMIN D 25 Hydroxy (Vit-D Deficiency, Fractures)   TSH   Need for hepatitis C screening test       Relevant Orders   Hepatitis C Antibody       Routine Health Maintenance and Physical Exam  Exercise Activities and Dietary recommendations  Goals   None     Immunization History  Administered Date(s) Administered   Influenza,inj,Quad PF,6+ Mos 02/24/2013, 02/21/2014, 01/27/2015   Influenza-Unspecified 01/30/2017   Tdap 10/11/2012    Health Maintenance  Topic Date Due   Pneumococcal Vaccine 28-86 Years old (1 - PCV) Never done   Hepatitis C Screening  Never done   PAP SMEAR-Modifier  04/26/2018   COVID-19 Vaccine (1) 02/05/2021 (Originally 05/04/1994)   INFLUENZA VACCINE  12/10/2020   TETANUS/TDAP  04/03/2029   HIV Screening  Completed   HPV VACCINES  Aged Out    Discussed health benefits of physical activity, and encouraged her to engage in regular exercise appropriate for her age and condition.  Return in about 4 months (around 04/01/2021) for ADD f/u, MDD/GAD f/u, virtual ok.     I,Essence Turner,acting as a Education administrator for [name], MD.,have documented all relevant documentation on the behalf of [name], MD,as directed by  [name], MD while in the presence of [name], MD.  I, [name], MD, have reviewed all documentation for this visit. The documentation on 11/29/20 for the exam, diagnosis, procedures, and orders are all accurate and complete.   Saniyya Gau, Dionne Bucy, MD, MPH [facility]

## 2020-11-29 NOTE — Assessment & Plan Note (Signed)
Discussed importance of healthy weight management Discussed diet and exercise  

## 2020-11-30 LAB — COMPREHENSIVE METABOLIC PANEL
ALT: 23 IU/L (ref 0–32)
AST: 16 IU/L (ref 0–40)
Albumin/Globulin Ratio: 1.8 (ref 1.2–2.2)
Albumin: 4.7 g/dL (ref 3.8–4.8)
Alkaline Phosphatase: 69 IU/L (ref 44–121)
BUN/Creatinine Ratio: 16 (ref 9–23)
BUN: 10 mg/dL (ref 6–20)
Bilirubin Total: <0.2 mg/dL (ref 0.0–1.2)
CO2: 23 mmol/L (ref 20–29)
Calcium: 9.7 mg/dL (ref 8.7–10.2)
Chloride: 103 mmol/L (ref 96–106)
Creatinine, Ser: 0.61 mg/dL (ref 0.57–1.00)
Globulin, Total: 2.6 g/dL (ref 1.5–4.5)
Glucose: 92 mg/dL (ref 65–99)
Potassium: 4 mmol/L (ref 3.5–5.2)
Sodium: 141 mmol/L (ref 134–144)
Total Protein: 7.3 g/dL (ref 6.0–8.5)
eGFR: 122 mL/min/{1.73_m2} (ref >59)

## 2020-11-30 LAB — CBC WITH DIFFERENTIAL/PLATELET
Basophils Absolute: 0.1 10*3/uL (ref 0.0–0.2)
Basos: 1 %
EOS (ABSOLUTE): 0.3 10*3/uL (ref 0.0–0.4)
Eos: 3 %
Hematocrit: 43.6 % (ref 34.0–46.6)
Hemoglobin: 14.4 g/dL (ref 11.1–15.9)
Immature Grans (Abs): 0 10*3/uL (ref 0.0–0.1)
Immature Granulocytes: 0 %
Lymphocytes Absolute: 3.8 10*3/uL — ABNORMAL HIGH (ref 0.7–3.1)
Lymphs: 43 %
MCH: 29.3 pg (ref 26.6–33.0)
MCHC: 33 g/dL (ref 31.5–35.7)
MCV: 89 fL (ref 79–97)
Monocytes Absolute: 0.4 10*3/uL (ref 0.1–0.9)
Monocytes: 5 %
Neutrophils Absolute: 4.2 10*3/uL (ref 1.4–7.0)
Neutrophils: 48 %
Platelets: 333 10*3/uL (ref 150–450)
RBC: 4.92 x10E6/uL (ref 3.77–5.28)
RDW: 12.4 % (ref 11.7–15.4)
WBC: 8.8 10*3/uL (ref 3.4–10.8)

## 2020-11-30 LAB — LIPID PANEL
Chol/HDL Ratio: 5.8 ratio — ABNORMAL HIGH (ref 0.0–4.4)
Cholesterol, Total: 175 mg/dL (ref 100–199)
HDL: 30 mg/dL — ABNORMAL LOW (ref >39)
LDL Chol Calc (NIH): 113 mg/dL — ABNORMAL HIGH (ref 0–99)
Triglycerides: 180 mg/dL — ABNORMAL HIGH (ref 0–149)
VLDL Cholesterol Cal: 32 mg/dL (ref 5–40)

## 2020-11-30 LAB — ANA W/REFLEX IF POSITIVE
Anti JO-1: <0.2 AI (ref 0.0–0.9)
Anti Nuclear Antibody (ANA): POSITIVE — AB
Centromere Ab Screen: <0.2 AI (ref 0.0–0.9)
Chromatin Ab SerPl-aCnc: <0.2 AI (ref 0.0–0.9)
ENA RNP Ab: <0.2 AI (ref 0.0–0.9)
ENA SM Ab Ser-aCnc: <0.2 AI (ref 0.0–0.9)
ENA SSA (RO) Ab: <0.2 AI (ref 0.0–0.9)
ENA SSB (LA) Ab: <0.2 AI (ref 0.0–0.9)
Scleroderma (Scl-70) (ENA) Antibody, IgG: <0.2 AI (ref 0.0–0.9)
dsDNA Ab: 13 IU/mL — ABNORMAL HIGH (ref 0–9)

## 2020-11-30 LAB — SEDIMENTATION RATE: Sed Rate: 2 mm/hr (ref 0–32)

## 2020-11-30 LAB — C-REACTIVE PROTEIN: CRP: 2 mg/L (ref 0–10)

## 2020-11-30 LAB — RHEUMATOID FACTOR: Rheumatoid fact SerPl-aCnc: <10 IU/mL (ref <14.0)

## 2020-11-30 LAB — HEPATITIS C ANTIBODY: Hep C Virus Ab: <0.1 s/co ratio (ref 0.0–0.9)

## 2020-11-30 LAB — VITAMIN D 25 HYDROXY (VIT D DEFICIENCY, FRACTURES): Vit D, 25-Hydroxy: 30.8 ng/mL (ref 30.0–100.0)

## 2020-11-30 LAB — TSH: TSH: 1.37 u[IU]/mL (ref 0.450–4.500)

## 2020-11-30 LAB — VITAMIN B12: Vitamin B-12: 453 pg/mL (ref 232–1245)

## 2020-11-30 NOTE — Progress Notes (Signed)
MAILBOX is full

## 2020-12-03 ENCOUNTER — Telehealth: Payer: Self-pay

## 2020-12-03 NOTE — Telephone Encounter (Signed)
Patient would like to speak to someone about there lab results, the autoimmune abnormalty.

## 2020-12-03 NOTE — Telephone Encounter (Signed)
See result note. KW 

## 2020-12-03 NOTE — Telephone Encounter (Signed)
See Result note 

## 2020-12-03 NOTE — Telephone Encounter (Signed)
Copied from CRM [id number]. Topic: General - Call Back - No Documentation >> Dec 03, 2020  1:21 PM Crist Infante wrote: Reason for CRM: pt would like a call back concerning the labs that were not normal.  Pt states she got a call Friday

## 2020-12-04 ENCOUNTER — Telehealth: Payer: Self-pay

## 2020-12-04 ENCOUNTER — Other Ambulatory Visit: Payer: Self-pay

## 2020-12-04 ENCOUNTER — Encounter: Payer: Self-pay | Admitting: Family Medicine

## 2020-12-04 DIAGNOSIS — M2550 Pain in unspecified joint: Secondary | ICD-10-CM

## 2020-12-04 NOTE — Telephone Encounter (Signed)
Please update the referral

## 2020-12-04 NOTE — Progress Notes (Unsigned)
Ref to rheum     Annual Wellness Visit     Patient: Dawn Martinez, Female    DOB: Aug 16, 1990, 30 y.o.   MRN: [id number] Visit Date: 12/04/2020  Today's Provider: Adline Peals, CMA   No chief complaint on file.  Subjective    Dawn Martinez is a 30 y.o. female who presents today for her Annual Wellness Visit. She reports consuming a {diet types:17450} diet. {Exercise:19826} She generally feels {well/fairly well/poorly:18703}. She reports sleeping {well/fairly well/poorly:18703}. She {does/does not:200015} have additional problems to discuss today.   HPI   {Show patient history (optional):23778}   Medications: Outpatient Medications Prior to Visit  Medication Sig   acetaminophen (TYLENOL) 500 MG tablet Take 1,000 mg by mouth every 6 (six) hours as needed for mild pain or headache.    ARIPiprazole (ABILIFY) 2 MG tablet Take 1 tablet (2 mg total) by mouth daily.   citalopram (CELEXA) 40 MG tablet TAKE 1 TABLET BY MOUTH EVERY DAY   fluticasone (FLONASE) 50 MCG/ACT nasal spray Place 2 sprays into both nostrils daily.   gabapentin (NEURONTIN) 300 MG capsule TAKE (1) CAPSULE BY MOUTH THREE TIMES DAILY   ibuprofen (ADVIL) 600 MG tablet Take 1 tablet (600 mg total) by mouth every 6 (six) hours.   methylphenidate (RITALIN) 5 MG tablet Take 1 tablet (5 mg total) by mouth 2 (two) times daily.   methylphenidate (RITALIN) 5 MG tablet Take 1 tablet (5 mg total) by mouth 2 (two) times daily.   methylphenidate (RITALIN) 5 MG tablet Take 1 tablet (5 mg total) by mouth 2 (two) times daily.   methylphenidate (RITALIN) 5 MG tablet Take 1 tablet (5 mg total) by mouth 2 (two) times daily.   No facility-administered medications prior to visit.    Allergies  Allergen Reactions   Cefaclor Hives   Duloxetine Nausea Only and Other (See Comments)    G.I. upset   Promethazine Hcl Other (See Comments)    hallucinations    Patient Care Team: Erasmo Downer, MD as PCP - General  (Family Medicine)  Review of Systems  {Labs  Heme  Chem  Endocrine  Serology  Results Review (optional):23779}    Objective    Vitals: LMP 11/27/2020 (Exact Date)  {Show previous vital signs (optional):23777}  Physical Exam ***  Most recent functional status assessment: In your present state of health, do you have any difficulty performing the following activities: 11/29/2020  Hearing? N  Vision? N  Difficulty concentrating or making decisions? Y  Walking or climbing stairs? N  Dressing or bathing? N  Doing errands, shopping? N  Some recent data might be hidden   Most recent fall risk assessment: Fall Risk  11/29/2020  Falls in the past year? 0  Number falls in past yr: 0  Injury with Fall? 0    Most recent depression screenings: PHQ 2/9 Scores 11/29/2020 03/26/2020  PHQ - 2 Score 0 0  PHQ- 9 Score 1 8   Most recent cognitive screening: No flowsheet data found. Most recent Audit-C alcohol use screening Alcohol Use Disorder Test (AUDIT) 11/29/2020  1. How often do you have a drink containing alcohol? 0  2. How many drinks containing alcohol do you have on a typical day when you are drinking? 0  3. How often do you have six or more drinks on one occasion? 0  AUDIT-C Score 0   A score of 3 or more in women, and 4 or more in men indicates increased risk for  alcohol abuse, EXCEPT if all of the points are from question 1   No results found for any visits on 12/04/20.  Assessment & Plan     Annual wellness visit done today including the all of the following: Reviewed patient's Family Medical History Reviewed and updated list of patient's medical providers Assessment of cognitive impairment was done Assessed patient's functional ability Established a written schedule for health screening services Health Risk Assessent Completed and Reviewed  Exercise Activities and Dietary recommendations  Goals   None     Immunization History  Administered Date(s)  Administered   Influenza,inj,Quad PF,6+ Mos 02/24/2013, 02/21/2014, 01/27/2015   Influenza-Unspecified 01/30/2017   Tdap 10/11/2012    Health Maintenance  Topic Date Due   Pneumococcal Vaccine 77-91 Years old (1 - PCV) Never done   PAP SMEAR-Modifier  04/26/2018   COVID-19 Vaccine (1) 02/05/2021 (Originally 05/04/1994)   INFLUENZA VACCINE  12/10/2020   TETANUS/TDAP  04/03/2029   Hepatitis C Screening  Completed   HIV Screening  Completed   HPV VACCINES  Aged Out     Discussed health benefits of physical activity, and encouraged her to engage in regular exercise appropriate for her age and condition.    ***  No follow-ups on file.     {provider attestation***:1}   Adline Peals, CMA  [facility] [phone] (phone) [phone] (fax)  [facility]

## 2020-12-04 NOTE — Progress Notes (Signed)
Patient aware of results and referral placed  

## 2020-12-04 NOTE — Telephone Encounter (Signed)
Copied from CRM [id number]. Topic: General - Other >> Dec 03, 2020  5:06 PM Glean Salen wrote: Reason for CRM: patient states received call about referral and she agrees with going to [facility] rheumatology

## 2021-01-15 ENCOUNTER — Other Ambulatory Visit: Payer: Self-pay | Admitting: Family Medicine

## 2021-01-15 DIAGNOSIS — F411 Generalized anxiety disorder: Secondary | ICD-10-CM

## 2021-01-27 ENCOUNTER — Other Ambulatory Visit: Payer: Self-pay | Admitting: Family Medicine

## 2021-01-27 DIAGNOSIS — F1010 Alcohol abuse, uncomplicated: Secondary | ICD-10-CM

## 2021-03-25 ENCOUNTER — Ambulatory Visit: Payer: Self-pay | Admitting: *Deleted

## 2021-03-25 NOTE — Telephone Encounter (Signed)
The patient has been experiencing cold and flu like symptoms for more than a week   The patient would like to discuss their symptoms further with someone   The patient has been experiencing cough, congestion and a runny nose for roughly 9-10 days   The patient has Taken Tylenol cold and flu severe as well as mucinex   Pt reports similar symptoms 02/22/21, resolved, now returned 9-10 days ago. Reports "Stuffy head" runny nose for greenish yellow drainage. Also reports sinus type pain, frontal headache. Denies fever, no SOB, no wheezing. States "I feel like it was all in my head, now in chest." Has been taking multiple OTC meds, little improvement. IS going for covid testing this afternoon.  No availability at practice. Advised E-Visit. Pt states will follow disposition, if worsens will got to UC. Care advise given, pt verbalizes understanding.      Reason for Disposition  Earache    FAcial, sinus pain  Answer Assessment - Initial Assessment Questions 1. ONSET: "When did the cough begin?"      10 days 2. SEVERITY: "How bad is the cough today?"      Moderate 3. SPUTUM: "Describe the color of your sputum" (none, dry cough; clear, white, yellow, green)     Dry, stuffy head 4. HEMOPTYSIS: "Are you coughing up any blood?" If so ask: "How much?" (flecks, streaks, tablespoons, etc.)     no 5. DIFFICULTY BREATHING: "Are you having difficulty breathing?" If Yes, ask: "How bad is it?" (e.g., mild, moderate, severe)    - MILD: No SOB at rest, mild SOB with walking, speaks normally in sentences, can lie down, no retractions, pulse < 100.    - MODERATE: SOB at rest, SOB with minimal exertion and prefers to sit, cannot lie down flat, speaks in phrases, mild retractions, audible wheezing, pulse 100-120.    - SEVERE: Very SOB at rest, speaks in single words, struggling to breathe, sitting hunched forward, retractions, pulse > 120      Moderate 6. FEVER: "Do you have a fever?" If Yes, ask: "What is your  temperature, how was it measured, and when did it start?"     no 7. CARDIAC HISTORY: "Do you have any history of heart disease?" (e.g., heart attack, congestive heart failure)      no 8. LUNG HISTORY: "Do you have any history of lung disease?"  (e.g., pulmonary embolus, asthma, emphysema)     no 9. PE RISK FACTORS: "Do you have a history of blood clots?" (or: recent major surgery, recent prolonged travel, bedridden)     no 10. OTHER SYMPTOMS: "Do you have any other symptoms?" (e.g., runny nose, wheezing, chest pain)       Runny nose, greenish, yellow, frontal headache, sinus pain 11. PREGNANCY: "Is there any chance you are pregnant?" "When was your last menstrual period?"       no  Protocols used: Cough - Acute Productive-A-AH

## 2021-04-01 ENCOUNTER — Ambulatory Visit: Payer: Self-pay

## 2021-04-01 NOTE — Telephone Encounter (Signed)
Patient called, unable to leave VM d/t mailbox being full. Will attempt again later.   Message from Darron Doom sent at 04/01/2021  1:16 PM EST  Patient called in stated that she have had sinus pressure congestion cough and facial pain say that its been going on for 2 weeks and getting worst. Please advise can be reached at Ph# [phone]

## 2021-04-01 NOTE — Telephone Encounter (Signed)
Pt called back, states she has had sinus pressure and congestion for 2 weeks 5 days now and not getting any better. She is at work right now and can't talk for long period of time so limited questions asked. Attempted to schedule VV but d/t insurance, [facility] [place] wouldn't be able to see pt. Advised pt she could do virtual UC visit and gave directions on where to go and do that online. Pt states she will go ahead and do that when she gets off work so she can get to feeling better before Thanksgiving. Advised pt to call back if symptoms dont improve or has questions. Pt verbalized understanding.   Reason for Disposition  [1] Sinus congestion (pressure, fullness) AND [2] present > 10 days  Answer Assessment - Initial Assessment Questions 1. LOCATION: "Where does it hurt?"      *No Answer* 2. ONSET: "When did the sinus pain start?"  (e.g., hours, days)      2 weeks 5 days ago 3. SEVERITY: "How bad is the pain?"   (Scale 1-10; mild, moderate or severe)   - MILD (1-3): doesn't interfere with normal activities    - MODERATE (4-7): interferes with normal activities (e.g., work or school) or awakens from sleep   - SEVERE (8-10): excruciating pain and patient unable to do any normal activities        moderate 4. RECURRENT SYMPTOM: "Have you ever had sinus problems before?" If Yes, ask: "When was the last time?" and "What happened that time?"      *No Answer* 5. NASAL CONGESTION: "Is the nose blocked?" If Yes, ask: "Can you open it or must you breathe through your mouth?"     *No Answer* 6. NASAL DISCHARGE: "Do you have discharge from your nose?" If so ask, "What color?"     *No Answer* 7. FEVER: "Do you have a fever?" If Yes, ask: "What is it, how was it measured, and when did it start?"      *No Answer* 8. OTHER SYMPTOMS: "Do you have any other symptoms?" (e.g., sore throat, cough, earache, difficulty breathing)     Yes  9. PREGNANCY: "Is there any chance you are pregnant?" "When was your last  menstrual period?"  Protocols used: Sinus Pain or Congestion-A-AH

## 2021-04-07 ENCOUNTER — Other Ambulatory Visit: Payer: Self-pay | Admitting: Family Medicine

## 2021-04-08 NOTE — Telephone Encounter (Signed)
Requested medication (s) are due for refill today: yes  Requested medication (s) are on the active medication list: yes  Last refill:  11/29/20  Future visit scheduled:yes  Notes to clinic:  Med not delegated to NT to RF   Requested Prescriptions  Pending Prescriptions Disp Refills   ARIPiprazole (ABILIFY) 2 MG tablet [Pharmacy Med Name: ARIPIPRAZOLE 2MG  TABLETS] 30 tablet 3    Sig: TAKE 1 TABLET(2 MG) BY MOUTH DAILY     Not Delegated - Psychiatry:  Antipsychotics - Second Generation (Atypical) - aripiprazole Failed - 04/07/2021  3:36 AM      Failed - This refill cannot be delegated      Passed - Valid encounter within last 6 months    Recent Outpatient Visits           4 months ago Encounter for annual physical exam   [facility] [place], [place], [place]   8 months ago Attention deficit disorder (ADD) without hyperactivity   [facility], [facility], MD   9 months ago Viral URI   [facility], [place], MD   11 months ago Acute non-recurrent pansinusitis   [facility] Ali Molina, [place], Alessandra Bevels   1 year ago Attention deficit disorder (ADD) without hyperactivity   [facility], [facility], MD       Future Appointments             In 1 week Bacigalupo, Marzella Schlein, MD [facility], [facility]

## 2021-04-10 NOTE — Progress Notes (Signed)
MyChart Video Visit    Virtual Visit via Video Note   This visit type was conducted due to national recommendations for restrictions regarding the COVID-19 Pandemic (e.g. social distancing) in an effort to limit this patient's exposure and mitigate transmission in our community. This patient is at least at moderate risk for complications without adequate follow up. This format is felt to be most appropriate for this patient at this time. Physical exam was limited by quality of the video and audio technology used for the visit.    Patient location: home Provider location: [facility] Persons involved in the visit: patient, provider  I discussed the limitations of evaluation and management by telemedicine and the availability of in person appointments. The patient expressed understanding and agreed to proceed.  Patient: Dawn Martinez   DOB: 08/29/1990   30 y.o. Female  MRN: [id number] Visit Date: 04/11/2021  Today's healthcare provider: Shirlee Latch, MD   Chief Complaint  Patient presents with   Follow-up    Subjective    HPI ADD f/u, MDD/GAD  Follow up for ADD  The patient was last seen for this 4 months ago. Changes made at last visit include Continue Ritalin BID. Patient reports tolerating well.  Follow up for MDD  The patient was last seen for this 4 months ago. Changes made at last visit include Continue celexa at current dose Did not tolerate Wellbutrin in the past Trial of abilify 2mg  daily for augmentation .  This makes her very sleepy, so she is only taking 1/2 tab  Depression screen [facility] 2/9 04/11/2021  Decreased Interest 0  Down, Depressed, Hopeless 0  PHQ - 2 Score 0  Altered sleeping 1  Tired, decreased energy 3  Change in appetite 0  Feeling bad or failure about yourself  0  Trouble concentrating 0  Moving slowly or fidgety/restless 0  Suicidal thoughts 0  PHQ-9 Score 4  Difficult doing work/chores Somewhat difficult      Follow up for GAD  The patient was last seen for this 4 months ago. Changes made at last visit include Continue celexa at current dose Encourage therapy.  GAD 7 : Generalized Anxiety Score 04/11/2021 12/10/2018  Nervous, Anxious, on Edge 0 3  Control/stop worrying 0 3  Worry too much - different things 0 2  Trouble relaxing 0 3  Restless 0 1  Easily annoyed or irritable 1 3  Afraid - awful might happen 1 1  Total GAD 7 Score 2 16  Anxiety Difficulty Not difficult at all Extremely difficult   Patient is requesting refill on the Gabapentin and her Flonase.   Medications: Outpatient Medications Prior to Visit  Medication Sig   acetaminophen (TYLENOL) 500 MG tablet Take 1,000 mg by mouth every 6 (six) hours as needed for mild pain or headache.    citalopram (CELEXA) 40 MG tablet TAKE 1 TABLET BY MOUTH EVERY DAY   [DISCONTINUED] ARIPiprazole (ABILIFY) 2 MG tablet TAKE 1 TABLET(2 MG) BY MOUTH DAILY (Patient taking differently: No sig reported)   [DISCONTINUED] gabapentin (NEURONTIN) 300 MG capsule TAKE 1 CAPSULE BY MOUTH THREE TIMES DAILY (Patient taking differently: TAKE 1 CAPSULE BY MOUTH THREE TIMES DAILY)   [DISCONTINUED] methylphenidate (RITALIN) 5 MG tablet Take 1 tablet (5 mg total) by mouth 2 (two) times daily.   [DISCONTINUED] methylphenidate (RITALIN) 5 MG tablet Take 1 tablet (5 mg total) by mouth 2 (two) times daily.   [DISCONTINUED] fluticasone (FLONASE) 50 MCG/ACT nasal spray Place 2 sprays  into both nostrils daily. (Patient not taking: Reported on 04/11/2021)   [DISCONTINUED] ibuprofen (ADVIL) 600 MG tablet Take 1 tablet (600 mg total) by mouth every 6 (six) hours. (Patient not taking: Reported on 04/11/2021)   [DISCONTINUED] methylphenidate (RITALIN) 5 MG tablet Take 1 tablet (5 mg total) by mouth 2 (two) times daily.   [DISCONTINUED] methylphenidate (RITALIN) 5 MG tablet Take 1 tablet (5 mg total) by mouth 2 (two) times daily.   No facility-administered medications prior  to visit.    Review of Systems  Constitutional: Negative.   Respiratory: Negative.    Cardiovascular: Negative.   Musculoskeletal:  Positive for arthralgias.  Neurological: Negative.      Objective    Wt 155 lb (70.3 kg)   BMI 30.27 kg/m    Physical Exam Constitutional:      General: She is not in acute distress.    Appearance: Normal appearance.  HENT:     Head: Normocephalic.  Pulmonary:     Effort: Pulmonary effort is normal. No respiratory distress.  Neurological:     Mental Status: She is alert and oriented to person, place, and time. Mental status is at baseline.       Assessment & Plan     Problem List Items Addressed This Visit       Other   GAD (generalized anxiety disorder)    Chronic and fairly well controlled Continue Celexa at current dose Encourage therapy Failed Wellbutrin and BuSpar in the past Having side effects from Abilify, so we will try to change to Rexulti as below      MDD (major depressive disorder) - Primary    Chronic and stable, but not to goal Continue Celexa at current dose Failed Wellbutrin in the past Having side effects from Abilify, so we will try switching to Rexulti 0.5 mg daily      ADD (attention deficit disorder)    Chronic and well controlled Continue ritalin BID at current dose      Alcohol abuse    Sober for >1.5 years Continue gabapentin qhs      Relevant Medications   gabapentin (NEURONTIN) 300 MG capsule   Polyarthralgia    Family history of RA, she has positive ANA Awaiting rheumatology appointment, which could not be scheduled for nearly a year with [facility] We will place new rheumatology referral for [facility] rheumatology to see if she can get in sooner there instead      Relevant Orders   Ambulatory referral to Rheumatology   Other Visit Diagnoses     Chronic seasonal allergic rhinitis due to pollen       Relevant Medications   fluticasone (FLONASE) 50 MCG/ACT nasal spray   Positive ANA  (antinuclear antibody)       Relevant Orders   Ambulatory referral to Rheumatology        Return in about 4 months (around 08/10/2021) for MDD/GAD f/u, ADD f/u, virtual ok.     I discussed the assessment and treatment plan with the patient. The patient was provided an opportunity to ask questions and all were answered. The patient agreed with the plan and demonstrated an understanding of the instructions.   The patient was advised to call back or seek an in-person evaluation if the symptoms worsen or if the condition fails to improve as anticipated.  I, Shirlee Latch, MD, have reviewed all documentation for this visit. The documentation on 04/11/21 for the exam, diagnosis, procedures, and orders are all accurate and complete.  Abisai Coble, Marzella Schlein, MD, MPH [facility]

## 2021-04-11 ENCOUNTER — Other Ambulatory Visit: Payer: Self-pay

## 2021-04-11 ENCOUNTER — Telehealth (INDEPENDENT_AMBULATORY_CARE_PROVIDER_SITE_OTHER): Payer: 59 | Admitting: Family Medicine

## 2021-04-11 ENCOUNTER — Encounter: Payer: Self-pay | Admitting: Family Medicine

## 2021-04-11 VITALS — Wt 155.0 lb

## 2021-04-11 DIAGNOSIS — R768 Other specified abnormal immunological findings in serum: Secondary | ICD-10-CM

## 2021-04-11 DIAGNOSIS — M2550 Pain in unspecified joint: Secondary | ICD-10-CM

## 2021-04-11 DIAGNOSIS — F411 Generalized anxiety disorder: Secondary | ICD-10-CM

## 2021-04-11 DIAGNOSIS — F3341 Major depressive disorder, recurrent, in partial remission: Secondary | ICD-10-CM | POA: Diagnosis not present

## 2021-04-11 DIAGNOSIS — F1010 Alcohol abuse, uncomplicated: Secondary | ICD-10-CM

## 2021-04-11 DIAGNOSIS — J301 Allergic rhinitis due to pollen: Secondary | ICD-10-CM

## 2021-04-11 DIAGNOSIS — F988 Other specified behavioral and emotional disorders with onset usually occurring in childhood and adolescence: Secondary | ICD-10-CM

## 2021-04-11 MED ORDER — METHYLPHENIDATE HCL 5 MG PO TABS: 5.0000 mg | ORAL_TABLET | Freq: Two times a day (BID) | ORAL | 0 refills | Status: DC

## 2021-04-11 MED ORDER — REXULTI 0.5 MG PO TABS: 0.5000 mg | ORAL_TABLET | Freq: Every day | ORAL | 5 refills | Status: DC

## 2021-04-11 MED ORDER — GABAPENTIN 300 MG PO CAPS
300.0000 mg | ORAL_CAPSULE | Freq: Every day | ORAL | 3 refills | Status: DC
  Filled 2021-09-20 – 2022-02-27 (×3): qty 90, 90d supply, fill #0

## 2021-04-11 MED ORDER — FLUTICASONE PROPIONATE 50 MCG/ACT NA SUSP: 2.0000 | Freq: Every day | NASAL | 5 refills | Status: DC

## 2021-04-11 NOTE — Assessment & Plan Note (Signed)
Chronic and fairly well controlled Continue Celexa at current dose Encourage therapy Failed Wellbutrin and BuSpar in the past Having side effects from Abilify, so we will try to change to Rexulti as below

## 2021-04-11 NOTE — Assessment & Plan Note (Signed)
Family history of RA, she has positive ANA Awaiting rheumatology appointment, which could not be scheduled for nearly a year with [facility] We will place new rheumatology referral for [facility] rheumatology to see if she can get in sooner there instead

## 2021-04-11 NOTE — Assessment & Plan Note (Signed)
Chronic and stable, but not to goal Continue Celexa at current dose Failed Wellbutrin in the past Having side effects from Abilify, so we will try switching to Rexulti 0.5 mg daily

## 2021-04-11 NOTE — Assessment & Plan Note (Signed)
Chronic and well controlled Continue ritalin BID at current dose

## 2021-04-11 NOTE — Assessment & Plan Note (Signed)
Sober for >1.5 years Continue gabapentin qhs

## 2021-04-18 ENCOUNTER — Ambulatory Visit: Payer: Self-pay | Admitting: Family Medicine

## 2021-06-06 ENCOUNTER — Telehealth: Payer: Self-pay | Admitting: Family Medicine

## 2021-06-06 DIAGNOSIS — F411 Generalized anxiety disorder: Secondary | ICD-10-CM

## 2021-06-06 MED ORDER — CITALOPRAM HYDROBROMIDE 40 MG PO TABS: ORAL_TABLET | ORAL | 1 refills | Status: DC

## 2021-06-06 NOTE — Telephone Encounter (Signed)
[facility] faxed refill request for the following medications:  citalopram (CELEXA) 40 MG tablet   Please advise.  

## 2021-07-19 DIAGNOSIS — E669 Obesity, unspecified: Secondary | ICD-10-CM | POA: Diagnosis not present

## 2021-07-19 DIAGNOSIS — R768 Other specified abnormal immunological findings in serum: Secondary | ICD-10-CM | POA: Diagnosis not present

## 2021-07-19 DIAGNOSIS — M2550 Pain in unspecified joint: Secondary | ICD-10-CM | POA: Diagnosis not present

## 2021-07-19 DIAGNOSIS — M791 Myalgia, unspecified site: Secondary | ICD-10-CM | POA: Diagnosis not present

## 2021-07-19 DIAGNOSIS — G894 Chronic pain syndrome: Secondary | ICD-10-CM | POA: Diagnosis not present

## 2021-07-19 DIAGNOSIS — M797 Fibromyalgia: Secondary | ICD-10-CM | POA: Diagnosis not present

## 2021-07-19 DIAGNOSIS — Z6830 Body mass index (BMI) 30.0-30.9, adult: Secondary | ICD-10-CM | POA: Diagnosis not present

## 2021-08-01 DIAGNOSIS — Z20822 Contact with and (suspected) exposure to covid-19: Secondary | ICD-10-CM | POA: Diagnosis not present

## 2021-08-01 DIAGNOSIS — R070 Pain in throat: Secondary | ICD-10-CM | POA: Diagnosis not present

## 2021-08-01 DIAGNOSIS — J020 Streptococcal pharyngitis: Secondary | ICD-10-CM | POA: Diagnosis not present

## 2021-08-12 ENCOUNTER — Telehealth: Payer: 59 | Admitting: Family Medicine

## 2021-08-12 NOTE — Progress Notes (Deleted)
? ? ?MyChart Video Visit ? ? ? ?Virtual Visit via Video Note  ? ?This visit type was conducted due to national recommendations for restrictions regarding the COVID-19 Pandemic (e.g. social distancing) in an effort to limit this patient's exposure and mitigate transmission in our community. This patient is at least at moderate risk for complications without adequate follow up. This format is felt to be most appropriate for this patient at this time. Physical exam was limited by quality of the video and audio technology used for the visit.  ? ?Patient location: *** ?Provider location: *** ? ?I discussed the limitations of evaluation and management by telemedicine and the availability of in person appointments. The patient expressed understanding and agreed to proceed. ? ?Patient: Dawn Martinez   DOB: 01/08/1991   31 y.o. Female  MRN: [id number] ?Visit Date: 08/12/2021 ? ?Today's healthcare provider: Shirlee Latch, MD  ? ?No chief complaint on file. ? ?Subjective  ?  ?HPI Patient seeing provider for 4 month f/u ADD, MDD/GAD ?Follow up for ADD ? ?The patient was last seen for this 4 months ago. ?Changes made at last visit include Continue ritalin BID at current dose. ?-----------------------------------------------------------------------------------------  ?Anxiety/MDD, Follow-up ? ?She was last seen for anxiety 4 months ago. ?Changes made at last visit include Continue Celexa at current dose ?Failed Wellbutrin in the past. Encourage Therapy ?Having side effects from Abilify, so we will try switching to Rexulti 0.5 mg daily ?  ?She reports {excellent/good/fair/poor:19665} compliance with treatment. ?She reports {good/fair/poor:18685} tolerance of treatment. ?She {is/is not:21021397} having side effects. {document side effects if present:1} ? ?She feels her anxiety is {Desc; severity:60313} and {improved/worse/unchanged:3041574} since last visit. ? ?Symptoms: ?{Yes/No:20286} chest pain {Yes/No:20286} difficulty  concentrating  ?{Yes/No:20286} dizziness {Yes/No:20286} fatigue  ?{Yes/No:20286} feelings of losing control {Yes/No:20286} insomnia  ?{Yes/No:20286} irritable {Yes/No:20286} palpitations  ?{Yes/No:20286} panic attacks {Yes/No:20286} racing thoughts  ?{Yes/No:20286} shortness of breath {Yes/No:20286} sweating  ?{Yes/No:20286} tremors/shakes   ? ?GAD-7 Results ? ?  04/11/2021  ? 10:08 AM 12/10/2018  ?  1:06 PM  ?GAD-7 Generalized Anxiety Disorder Screening Tool  ?1. Feeling Nervous, Anxious, or on Edge 0 3  ?2. Not Being Able to Stop or Control Worrying 0 3  ?3. Worrying Too Much About Different Things 0 2  ?4. Trouble Relaxing 0 3  ?5. Being So Restless it's Hard To Sit Still 0 1  ?6. Becoming Easily Annoyed or Irritable 1 3  ?7. Feeling Afraid As If Something Awful Might Happen 1 1  ?Total GAD-7 Score 2 16  ?Difficulty At Work, Home, or Getting  Along With Others? Not difficult at all Extremely difficult  ? ? ?PHQ-9 Scores ? ?  04/11/2021  ? 10:06 AM 11/29/2020  ?  1:32 PM 03/26/2020  ?  3:04 PM  ?Depression screen PHQ 2/9  ?Decreased Interest 0 0 0  ?Down, Depressed, Hopeless 0 0 0  ?PHQ - 2 Score 0 0 0  ?Altered sleeping 1 0 0  ?Tired, decreased energy 3 1 3   ?Change in appetite 0 0 1  ?Feeling bad or failure about yourself  0 0 1  ?Trouble concentrating 0 0 3  ?Moving slowly or fidgety/restless 0 0 0  ?Suicidal thoughts 0 0 0  ?PHQ-9 Score 4 1 8   ?Difficult doing work/chores Somewhat difficult Somewhat difficult Somewhat difficult  ? ?---------------------------------------------------------------------------------------------------  ?Medications: ?Outpatient Medications Prior to Visit  ?Medication Sig  ? acetaminophen (TYLENOL) 500 MG tablet Take 1,000 mg by mouth every 6 (six) hours as needed for  mild pain or headache.   ? Brexpiprazole (REXULTI) 0.5 MG TABS Take 1 tablet (0.5 mg total) by mouth daily.  ? citalopram (CELEXA) 40 MG tablet TAKE 1 TABLET BY MOUTH EVERY DAY  ? fluticasone (FLONASE) 50 MCG/ACT nasal  spray Place 2 sprays into both nostrils daily.  ? gabapentin (NEURONTIN) 300 MG capsule Take 1 capsule (300 mg total) by mouth at bedtime.  ? methylphenidate (RITALIN) 5 MG tablet Take 1 tablet (5 mg total) by mouth 2 (two) times daily.  ? methylphenidate (RITALIN) 5 MG tablet Take 1 tablet (5 mg total) by mouth 2 (two) times daily.  ? methylphenidate (RITALIN) 5 MG tablet Take 1 tablet (5 mg total) by mouth 2 (two) times daily.  ? methylphenidate (RITALIN) 5 MG tablet Take 1 tablet (5 mg total) by mouth 2 (two) times daily.  ? ?No facility-administered medications prior to visit.  ? ? ?Review of Systems ? ?{Labs  Heme  Chem  Endocrine  Serology  Results Review (optional):23779} ? ? Objective  ?  ?There were no vitals taken for this visit. ? ?{Show previous vital signs (optional):23777} ? ? ?Physical Exam  ? ? ? Assessment & Plan  ?  ? ?*** ? ?No follow-ups on file.  ?  ? ?I discussed the assessment and treatment plan with the patient. The patient was provided an opportunity to ask questions and all were answered. The patient agreed with the plan and demonstrated an understanding of the instructions. ?  ?The patient was advised to call back or seek an in-person evaluation if the symptoms worsen or if the condition fails to improve as anticipated. ? ?I provided *** minutes of non-face-to-face time during this encounter. ? ?{provider attestation***:1} ? ?Shirlee Latch, MD ?[facility] ?[phone] (phone) ?[phone] (fax) ? ?[facility]   ?

## 2021-09-18 NOTE — Progress Notes (Signed)
?  ? ?I,Sulibeya S Dimas,acting as a scribe for Shirlee Latch, MD.,have documented all relevant documentation on the behalf of Shirlee Latch, MD,as directed by  Shirlee Latch, MD while in the presence of Shirlee Latch, MD. ? ? ?Established patient visit ? ? ?Patient: Dawn Martinez   DOB: 04-Jun-1990   31 y.o. Female  MRN: [id number] ?Visit Date: 09/20/2021 ? ?Today's healthcare provider: Shirlee Latch, MD  ? ?Chief Complaint  ?Patient presents with  ? ADHD  ? Depression  ? Anxiety  ? ?Subjective  ?  ?HPI  ?Follow up for ADD ? ?The patient was last seen for this 5 months ago. ?Changes made at last visit include no changes. ? ?She reports good compliance with treatment previously. Has been off Ritalin since January due to no insurance. ?She feels that condition is worse.  ?She is not having side effects.  ? ?----------------------------------------------------------------------------------------- ?Anxiety/depression, Follow-up ? ?She was last seen for anxiety 5 months ago. ?Changes made at last visit include start Rexulti 0.58mg  daily. Patient just started Abilify 2mg  daily. Patient takes half tablet daily. ?  ?She reports  she was not able to afford with insurance . ?She reports excellent tolerance of treatment. ?She is not having side effects.  ? ?She feels her anxiety is moderate and Worse since last visit. ? ?Symptoms: ?No chest pain Yes difficulty concentrating  ?No dizziness Yes fatigue  ?No feelings of losing control Yes insomnia  ?Yes irritable No palpitations  ?No panic attacks No racing thoughts  ?No shortness of breath No sweating  ?No tremors/shakes   ? ?GAD-7 Results ? ?  04/11/2021  ? 10:08 AM 12/10/2018  ?  1:06 PM  ?GAD-7 Generalized Anxiety Disorder Screening Tool  ?1. Feeling Nervous, Anxious, or on Edge 0 3  ?2. Not Being Able to Stop or Control Worrying 0 3  ?3. Worrying Too Much About Different Things 0 2  ?4. Trouble Relaxing 0 3  ?5. Being So Restless it's Hard To Sit Still  0 1  ?6. Becoming Easily Annoyed or Irritable 1 3  ?7. Feeling Afraid As If Something Awful Might Happen 1 1  ?Total GAD-7 Score 2 16  ?Difficulty At Work, Home, or Getting  Along With Others? Not difficult at all Extremely difficult  ? ? ?PHQ-9 Scores ? ?  09/20/2021  ?  8:38 AM 04/11/2021  ? 10:06 AM 11/29/2020  ?  1:32 PM  ?PHQ9 SCORE ONLY  ?PHQ-9 Total Score 15 4 1   ? ? ?--------------------------------------------------------------------------------------------------- ? ? ?Medications: ?Outpatient Medications Prior to Visit  ?Medication Sig  ? acetaminophen (TYLENOL) 500 MG tablet Take 1,000 mg by mouth every 6 (six) hours as needed for mild pain or headache.   ? citalopram (CELEXA) 40 MG tablet TAKE 1 TABLET BY MOUTH EVERY DAY  ? fluticasone (FLONASE) 50 MCG/ACT nasal spray Place 2 sprays into both nostrils daily.  ? gabapentin (NEURONTIN) 300 MG capsule Take 1 capsule (300 mg total) by mouth at bedtime.  ? [DISCONTINUED] Brexpiprazole (REXULTI) 0.5 MG TABS Take 1 tablet (0.5 mg total) by mouth daily.  ? [DISCONTINUED] methylphenidate (RITALIN) 5 MG tablet Take 1 tablet (5 mg total) by mouth 2 (two) times daily.  ? [DISCONTINUED] methylphenidate (RITALIN) 5 MG tablet Take 1 tablet (5 mg total) by mouth 2 (two) times daily.  ? [DISCONTINUED] methylphenidate (RITALIN) 5 MG tablet Take 1 tablet (5 mg total) by mouth 2 (two) times daily.  ? [DISCONTINUED] methylphenidate (RITALIN) 5 MG tablet Take 1 tablet (5 mg  total) by mouth 2 (two) times daily.  ? [DISCONTINUED] ARIPiprazole (ABILIFY) 2 MG tablet Take 1 mg by mouth daily.  ? ?No facility-administered medications prior to visit.  ? ? ?Review of Systems per HPI ? ?  ?  Objective  ?  ?BP 102/75 (BP Location: Left Arm, Patient Position: Sitting, Cuff Size: Large)   Pulse 86   Temp 99 ?F (37.2 ?C) (Oral)   Resp 16   Wt 155 lb 1.6 oz (70.4 kg)   LMP 09/09/2021 (Approximate)   BMI 30.29 kg/m?  ?BP Readings from Last 3 Encounters:  ?09/20/21 102/75  ?11/29/20  112/66  ?12/22/19 122/78  ? ?Wt Readings from Last 3 Encounters:  ?09/20/21 155 lb 1.6 oz (70.4 kg)  ?04/11/21 155 lb (70.3 kg)  ?11/29/20 154 lb 4.8 oz (70 kg)  ? ?  ? ?Physical Exam ?Vitals reviewed.  ?Constitutional:   ?   General: She is not in acute distress. ?   Appearance: Normal appearance. She is well-developed. She is not diaphoretic.  ?HENT:  ?   Head: Normocephalic and atraumatic.  ?Eyes:  ?   General: No scleral icterus. ?   Conjunctiva/sclera: Conjunctivae normal.  ?Neck:  ?   Thyroid: No thyromegaly.  ?Cardiovascular:  ?   Rate and Rhythm: Normal rate and regular rhythm.  ?   Pulses: Normal pulses.  ?   Heart sounds: Normal heart sounds. No murmur heard. ?Pulmonary:  ?   Effort: Pulmonary effort is normal. No respiratory distress.  ?   Breath sounds: Normal breath sounds. No wheezing, rhonchi or rales.  ?Musculoskeletal:  ?   Cervical back: Neck supple.  ?   Right lower leg: No edema.  ?   Left lower leg: No edema.  ?Lymphadenopathy:  ?   Cervical: No cervical adenopathy.  ?Skin: ?   General: Skin is warm and dry.  ?Neurological:  ?   Mental Status: She is alert and oriented to person, place, and time. Mental status is at baseline.  ?Psychiatric:     ?   Mood and Affect: Mood is depressed. Affect is flat.     ?   Behavior: Behavior normal.  ?  ? ? ?No results found for any visits on 09/20/21. ? Assessment & Plan  ?  ? ?Problem List Items Addressed This Visit   ? ?  ? Other  ? GAD (generalized anxiety disorder) - Primary  ?  Chronic and fairly well controlled ?Depression is not controlled as above ?Continue Celexa at current dose ?Encourage therapy ?Failed Wellbutrin and BuSpar in the past ?Abilify as above for depression augmentation ? ?  ?  ? MDD (major depressive disorder)  ?  Chronic and uncontrolled ?Continue Celexa at current dose ?Failed Wellbutrin in the past ?Unable to afford Rexulti, even with insurance coverage ?Having side effects from Abilify, but did cut back to 1 mg daily ?We will give  this another 1 to 2 months to see if it improves ?Encourage [facility] for counseling ? ?  ?  ? ADD (attention deficit disorder)  ?  Previously well controlled, but she has been out of her medication for many months due to lack of insurance coverage ?We will resume this at previous dose today ?Controlling her ADHD may also help with her depression and anxiety symptoms as well ? ?  ?  ?  ? ?Return in about 2 months (around 11/20/2021) for CPE, as scheduled.  ?   ? ?I, Shirlee LatchAngela Maliq Pilley, MD, have reviewed all documentation  for this visit. The documentation on 09/20/21 for the exam, diagnosis, procedures, and orders are all accurate and complete. ? ? ?Erasmo Downer, MD, MPH ?[facility] ?[facility]   ?

## 2021-09-20 ENCOUNTER — Ambulatory Visit (INDEPENDENT_AMBULATORY_CARE_PROVIDER_SITE_OTHER): Payer: 59 | Admitting: Family Medicine

## 2021-09-20 ENCOUNTER — Encounter: Payer: Self-pay | Admitting: Family Medicine

## 2021-09-20 ENCOUNTER — Other Ambulatory Visit: Payer: Self-pay

## 2021-09-20 VITALS — BP 102/75 | HR 86 | Temp 99.0°F | Resp 16 | Wt 155.1 lb

## 2021-09-20 DIAGNOSIS — F3341 Major depressive disorder, recurrent, in partial remission: Secondary | ICD-10-CM

## 2021-09-20 DIAGNOSIS — F411 Generalized anxiety disorder: Secondary | ICD-10-CM

## 2021-09-20 DIAGNOSIS — F988 Other specified behavioral and emotional disorders with onset usually occurring in childhood and adolescence: Secondary | ICD-10-CM

## 2021-09-20 MED ORDER — METHYLPHENIDATE HCL 5 MG PO TABS
5.0000 mg | ORAL_TABLET | Freq: Two times a day (BID) | ORAL | 0 refills | Status: DC
  Filled 2021-09-20: qty 60, 30d supply, fill #0

## 2021-09-20 MED ORDER — METHYLPHENIDATE HCL 5 MG PO TABS
5.0000 mg | ORAL_TABLET | Freq: Two times a day (BID) | ORAL | 0 refills | Status: DC
  Filled 2021-09-20 – 2021-12-27 (×2): qty 60, 30d supply, fill #0

## 2021-09-20 MED ORDER — ARIPIPRAZOLE 2 MG PO TABS
ORAL_TABLET | ORAL | 3 refills | Status: DC
  Filled 2021-09-20: qty 30, 30d supply, fill #0

## 2021-09-20 MED ORDER — REXULTI 0.5 MG PO TABS
ORAL_TABLET | ORAL | 5 refills | Status: DC
Start: 2021-04-11 — End: 2021-12-05
  Filled 2021-09-20: qty 30, 30d supply, fill #0

## 2021-09-20 MED ORDER — METHYLPHENIDATE HCL 5 MG PO TABS
5.0000 mg | ORAL_TABLET | Freq: Two times a day (BID) | ORAL | 0 refills | Status: DC
  Filled 2021-09-20 – 2022-02-27 (×3): qty 60, 30d supply, fill #0

## 2021-09-20 MED ORDER — FLUTICASONE PROPIONATE 50 MCG/ACT NA SUSP
NASAL | 5 refills | Status: DC
  Filled 2021-09-20: qty 48, 90d supply, fill #0

## 2021-09-20 MED ORDER — ARIPIPRAZOLE 2 MG PO TABS
1.0000 mg | ORAL_TABLET | Freq: Every day | ORAL | 3 refills | Status: DC
  Filled 2021-09-20: qty 15, 30d supply, fill #0

## 2021-09-20 NOTE — Assessment & Plan Note (Signed)
Chronic and fairly well controlled ?Depression is not controlled as above ?Continue Celexa at current dose ?Encourage therapy ?Failed Wellbutrin and BuSpar in the past ?Abilify as above for depression augmentation ?

## 2021-09-20 NOTE — Assessment & Plan Note (Signed)
Previously well controlled, but she has been out of her medication for many months due to lack of insurance coverage ?We will resume this at previous dose today ?Controlling her ADHD may also help with her depression and anxiety symptoms as well ?

## 2021-09-20 NOTE — Assessment & Plan Note (Signed)
Chronic and uncontrolled ?Continue Celexa at current dose ?Failed Wellbutrin in the past ?Unable to afford Rexulti, even with insurance coverage ?Having side effects from Abilify, but did cut back to 1 mg daily ?We will give this another 1 to 2 months to see if it improves ?Encourage [facility] for counseling ?

## 2021-09-23 ENCOUNTER — Other Ambulatory Visit: Payer: Self-pay

## 2021-10-03 DIAGNOSIS — H6691 Otitis media, unspecified, right ear: Secondary | ICD-10-CM | POA: Diagnosis not present

## 2021-11-28 ENCOUNTER — Other Ambulatory Visit: Payer: Self-pay

## 2021-11-28 DIAGNOSIS — Z124 Encounter for screening for malignant neoplasm of cervix: Secondary | ICD-10-CM | POA: Diagnosis not present

## 2021-11-28 DIAGNOSIS — Z309 Encounter for contraceptive management, unspecified: Secondary | ICD-10-CM | POA: Diagnosis not present

## 2021-11-28 DIAGNOSIS — Z6831 Body mass index (BMI) 31.0-31.9, adult: Secondary | ICD-10-CM | POA: Diagnosis not present

## 2021-11-28 DIAGNOSIS — Z113 Encounter for screening for infections with a predominantly sexual mode of transmission: Secondary | ICD-10-CM | POA: Diagnosis not present

## 2021-11-28 DIAGNOSIS — Z01419 Encounter for gynecological examination (general) (routine) without abnormal findings: Secondary | ICD-10-CM | POA: Diagnosis not present

## 2021-11-28 LAB — HM PAP SMEAR: HM Pap smear: ABNORMAL

## 2021-11-28 MED ORDER — NORETHIN ACE-ETH ESTRAD-FE 1-20 MG-MCG PO TABS
1.0000 | ORAL_TABLET | Freq: Every day | ORAL | 3 refills | Status: DC
  Filled 2021-11-28 – 2022-02-27 (×3): qty 84, 84d supply, fill #0

## 2021-12-04 NOTE — Progress Notes (Unsigned)
I,Sha'taria Tyson,acting as a Neurosurgeon for [employer], PA-C.,have documented all relevant documentation on the behalf of Alfredia Ferguson, PA-C,as directed by  Alfredia Ferguson, PA-C while in the presence of Alfredia Ferguson, PA-C.   Complete physical exam   Patient: Dawn Martinez   DOB: [birth year]   31 y.o. Female  MRN: [id number] Visit Date: 12/05/2021  Today's healthcare provider: Alfredia Ferguson, PA-C   No chief complaint on file.  Subjective    Dawn Martinez is a 31 y.o. female who presents today for a complete physical exam.  She reports consuming a {diet types:17450} diet. {Exercise:19826} She generally feels {well/fairly well/poorly:18703}. She reports sleeping {well/fairly well/poorly:18703}. She {does/does not:200015} have additional problems to discuss today.  HPI  ***  Past Medical History:  Diagnosis Date   ADHD (attention deficit hyperactivity disorder)    Anxiety    Past Surgical History:  Procedure Laterality Date   APPENDECTOMY     Social History   Socioeconomic History   Marital status: Legally Separated    Spouse name: Not on file   Number of children: Not on file   Years of education: Not on file   Highest education level: Not on file  Occupational History   Not on file  Tobacco Use   Smoking status: Former    Types: Cigarettes    Quit date: 11/18/2014    Years since quitting: 7.0   Smokeless tobacco: Never  Vaping Use   Vaping Use: Never used  Substance and Sexual Activity   Alcohol use: No    Alcohol/week: 0.0 standard drinks of alcohol    Comment: occasional   Drug use: No   Sexual activity: Yes    Birth control/protection: None  Other Topics Concern   Not on file  Social History Narrative   Not on file   Social Determinants of Health   Financial Resource Strain: Not on file  Food Insecurity: Not on file  Transportation Needs: Not on file  Physical Activity: Not on file  Stress: Not on file  Social Connections: Not on  file  Intimate Partner Violence: Not on file   Family Status  Relation Name Status   Mother  Alive   Brother  Alive   Brother 1/2 brother Alive   Sister 1/2 sister Alive   Father  Deceased   Family History  Problem Relation Age of Onset   Depression Mother    Anxiety disorder Mother    Thyroid disease Mother    ADD / ADHD Brother    Diabetes Brother    Gallbladder disease Sister    Endometriosis Sister    Heart disease Father    Diabetes Father    Hypertension Father    Congestive Heart Failure Father    Prostate cancer Father    Leukemia Father    Allergies  Allergen Reactions   Cefaclor Hives   Duloxetine Nausea Only and Other (See Comments)    G.I. upset   Promethazine Hcl Other (See Comments)    hallucinations    Patient Care Team: Erasmo Downer, MD as PCP - General (Family Medicine)   Medications: Outpatient Medications Prior to Visit  Medication Sig   acetaminophen (TYLENOL) 500 MG tablet Take 1,000 mg by mouth every 6 (six) hours as needed for mild pain or headache.    ARIPiprazole (ABILIFY) 2 MG tablet Take 0.5 tablets (1 mg total) by mouth daily.   ARIPiprazole (ABILIFY) 2 MG tablet Take 1 tablet by mouth daily  Brexpiprazole (REXULTI) 0.5 MG TABS Take 1 tablet by mouth daily   citalopram (CELEXA) 40 MG tablet TAKE 1 TABLET BY MOUTH EVERY DAY   fluticasone (FLONASE) 50 MCG/ACT nasal spray Place 2 sprays into both nostrils daily.   fluticasone (FLONASE) 50 MCG/ACT nasal spray Shake liquid and use 2 sprays in each nostril daily   gabapentin (NEURONTIN) 300 MG capsule Take 1 capsule (300 mg total) by mouth at bedtime.   methylphenidate (RITALIN) 5 MG tablet Take 1 tablet (5 mg total) by mouth 2 (two) times daily.   methylphenidate (RITALIN) 5 MG tablet Take 1 tablet (5 mg total) by mouth 2 (two) times daily.   methylphenidate (RITALIN) 5 MG tablet Take 1 tablet (5 mg total) by mouth 2 (two) times daily.   methylphenidate (RITALIN) 5 MG tablet Take 1  tablet (5 mg total) by mouth 2 (two) times daily.   norethindrone-ethinyl estradiol-FE (BLISOVI FE 1/20) 1-20 MG-MCG tablet TAKE 1 TABLET BY MOUTH EVERY DAY   No facility-administered medications prior to visit.    Review of Systems  {Labs  Heme  Chem  Endocrine  Serology  Results Review (optional):23779}  Objective    There were no vitals taken for this visit. {Show previous vital signs (optional):23777}   Physical Exam  ***  Last depression screening scores    09/20/2021    8:38 AM 04/11/2021   10:06 AM 11/29/2020    1:32 PM  PHQ 2/9 Scores  PHQ - 2 Score 4 0 0  PHQ- 9 Score 15 4 1    Last fall risk screening    11/29/2020    1:32 PM  Fall Risk   Falls in the past year? 0  Number falls in past yr: 0  Injury with Fall? 0   Last Audit-C alcohol use screening    09/20/2021    8:39 AM  Alcohol Use Disorder Test (AUDIT)  1. How often do you have a drink containing alcohol? 0  2. How many drinks containing alcohol do you have on a typical day when you are drinking? 0  3. How often do you have six or more drinks on one occasion? 0  AUDIT-C Score 0   A score of 3 or more in women, and 4 or more in men indicates increased risk for alcohol abuse, EXCEPT if all of the points are from question 1   No results found for any visits on 12/05/21.  Assessment & Plan    Routine Health Maintenance and Physical Exam  Exercise Activities and Dietary recommendations  Goals   None     Immunization History  Administered Date(s) Administered   Influenza,inj,Quad PF,6+ Mos 02/24/2013, 02/21/2014, 01/27/2015   Influenza-Unspecified 01/30/2017   Tdap 10/11/2012    Health Maintenance  Topic Date Due   COVID-19 Vaccine (1) Never done   PAP SMEAR-Modifier  04/26/2018   INFLUENZA VACCINE  12/10/2021   TETANUS/TDAP  04/03/2029   Hepatitis C Screening  Completed   HIV Screening  Completed   HPV VACCINES  Aged Out    Discussed health benefits of physical activity, and  encouraged her to engage in regular exercise appropriate for her age and condition.  ***  No follow-ups on file.     {provider attestation***:1}   04/05/2029, PA-C  [facility] [phone] (phone) [phone] (fax)  [facility]

## 2021-12-05 ENCOUNTER — Encounter: Payer: Self-pay | Admitting: Physician Assistant

## 2021-12-05 ENCOUNTER — Ambulatory Visit (INDEPENDENT_AMBULATORY_CARE_PROVIDER_SITE_OTHER): Payer: 59 | Admitting: Physician Assistant

## 2021-12-05 ENCOUNTER — Encounter: Payer: 59 | Admitting: Family Medicine

## 2021-12-05 VITALS — BP 114/82 | HR 68 | Temp 98.1°F | Ht 59.0 in | Wt 155.5 lb

## 2021-12-05 DIAGNOSIS — F411 Generalized anxiety disorder: Secondary | ICD-10-CM | POA: Diagnosis not present

## 2021-12-05 DIAGNOSIS — Z0000 Encounter for general adult medical examination without abnormal findings: Secondary | ICD-10-CM | POA: Diagnosis not present

## 2021-12-05 DIAGNOSIS — E782 Mixed hyperlipidemia: Secondary | ICD-10-CM

## 2021-12-05 DIAGNOSIS — F3341 Major depressive disorder, recurrent, in partial remission: Secondary | ICD-10-CM | POA: Diagnosis not present

## 2021-12-05 DIAGNOSIS — D649 Anemia, unspecified: Secondary | ICD-10-CM | POA: Diagnosis not present

## 2021-12-05 DIAGNOSIS — F988 Other specified behavioral and emotional disorders with onset usually occurring in childhood and adolescence: Secondary | ICD-10-CM | POA: Diagnosis not present

## 2021-12-05 NOTE — Patient Instructions (Addendum)
[email or web address]  Emergency Mental Health Services:  [facility] (like an urgent care for mental health) [address] [place], [place] [address] [phone]  [facility] Several different mental health services, has a crisis center  [place], [place] [address] [place], [place] [address]  [phone] Same-Day Access Hours:  Monday, Wednesday and Friday, 8am - 3pm Crisis & Diversion Center: Walk-In Crisis Hours: 7 days/week, 8am - 12am -  [facility] [employer] -  Patent examiner Drop-Off (side door)  -988- emergent mental health hotline. Call or text.

## 2021-12-05 NOTE — Assessment & Plan Note (Signed)
Pt still feels depression, but reports stability. Continue celexa 40 mg

## 2021-12-05 NOTE — Assessment & Plan Note (Signed)
Continue celexa 40 mg Advised she reach out for a therapist/counselor. If needed can refer her to a local office, but d/t long wait times for appointments, gave resources for virtual counseling.  Gave resources for emergent services.

## 2021-12-05 NOTE — Assessment & Plan Note (Signed)
Stable on ritalin . Continue meds

## 2021-12-06 LAB — LIPID PANEL
Chol/HDL Ratio: 5.2 ratio — ABNORMAL HIGH (ref 0.0–4.4)
Cholesterol, Total: 170 mg/dL (ref 100–199)
HDL: 33 mg/dL — ABNORMAL LOW (ref >39)
LDL Chol Calc (NIH): 114 mg/dL — ABNORMAL HIGH (ref 0–99)
Triglycerides: 129 mg/dL (ref 0–149)
VLDL Cholesterol Cal: 23 mg/dL (ref 5–40)

## 2021-12-06 LAB — CBC WITH DIFFERENTIAL/PLATELET
Basophils Absolute: 0.1 10*3/uL (ref 0.0–0.2)
Basos: 1 %
EOS (ABSOLUTE): 0.2 10*3/uL (ref 0.0–0.4)
Eos: 2 %
Hematocrit: 39.9 % (ref 34.0–46.6)
Hemoglobin: 13.3 g/dL (ref 11.1–15.9)
Immature Grans (Abs): 0 10*3/uL (ref 0.0–0.1)
Immature Granulocytes: 0 %
Lymphocytes Absolute: 2.6 10*3/uL (ref 0.7–3.1)
Lymphs: 37 %
MCH: 29.5 pg (ref 26.6–33.0)
MCHC: 33.3 g/dL (ref 31.5–35.7)
MCV: 89 fL (ref 79–97)
Monocytes Absolute: 0.5 10*3/uL (ref 0.1–0.9)
Monocytes: 7 %
Neutrophils Absolute: 3.7 10*3/uL (ref 1.4–7.0)
Neutrophils: 53 %
Platelets: 315 10*3/uL (ref 150–450)
RBC: 4.51 x10E6/uL (ref 3.77–5.28)
RDW: 12.2 % (ref 11.7–15.4)
WBC: 7 10*3/uL (ref 3.4–10.8)

## 2021-12-06 LAB — COMPREHENSIVE METABOLIC PANEL
ALT: 19 IU/L (ref 0–32)
AST: 16 IU/L (ref 0–40)
Albumin/Globulin Ratio: 2 (ref 1.2–2.2)
Albumin: 4.7 g/dL (ref 3.9–4.9)
Alkaline Phosphatase: 67 IU/L (ref 44–121)
BUN/Creatinine Ratio: 12 (ref 9–23)
BUN: 8 mg/dL (ref 6–20)
Bilirubin Total: 0.2 mg/dL (ref 0.0–1.2)
CO2: 22 mmol/L (ref 20–29)
Calcium: 9.3 mg/dL (ref 8.7–10.2)
Chloride: 102 mmol/L (ref 96–106)
Creatinine, Ser: 0.67 mg/dL (ref 0.57–1.00)
Globulin, Total: 2.3 g/dL (ref 1.5–4.5)
Glucose: 87 mg/dL (ref 70–99)
Potassium: 4.6 mmol/L (ref 3.5–5.2)
Sodium: 139 mmol/L (ref 134–144)
Total Protein: 7 g/dL (ref 6.0–8.5)
eGFR: 119 mL/min/{1.73_m2} (ref >59)

## 2021-12-06 LAB — TSH+FREE T4
Free T4: 1.03 ng/dL (ref 0.82–1.77)
TSH: 1.35 u[IU]/mL (ref 0.450–4.500)

## 2021-12-06 LAB — VITAMIN D 25 HYDROXY (VIT D DEFICIENCY, FRACTURES): Vit D, 25-Hydroxy: 30.9 ng/mL (ref 30.0–100.0)

## 2021-12-09 ENCOUNTER — Other Ambulatory Visit: Payer: Self-pay

## 2021-12-09 ENCOUNTER — Telehealth: Payer: Self-pay | Admitting: Family Medicine

## 2021-12-09 DIAGNOSIS — F411 Generalized anxiety disorder: Secondary | ICD-10-CM

## 2021-12-09 MED ORDER — CITALOPRAM HYDROBROMIDE 40 MG PO TABS: ORAL_TABLET | ORAL | 1 refills | Status: DC

## 2021-12-09 NOTE — Telephone Encounter (Signed)
Refilled

## 2021-12-09 NOTE — Telephone Encounter (Signed)
[facility] faxed refill request for the following medications:  citalopram (CELEXA) 40 MG tablet   Please advise.  

## 2021-12-13 ENCOUNTER — Other Ambulatory Visit: Payer: Self-pay

## 2021-12-27 ENCOUNTER — Other Ambulatory Visit: Payer: Self-pay

## 2021-12-27 MED ORDER — CITALOPRAM HYDROBROMIDE 40 MG PO TABS
ORAL_TABLET | ORAL | 1 refills | Status: DC
  Filled 2021-12-27 – 2022-02-27 (×3): qty 90, 90d supply, fill #0

## 2022-01-17 ENCOUNTER — Other Ambulatory Visit: Payer: Self-pay

## 2022-01-30 DIAGNOSIS — N879 Dysplasia of cervix uteri, unspecified: Secondary | ICD-10-CM | POA: Diagnosis not present

## 2022-01-30 DIAGNOSIS — N888 Other specified noninflammatory disorders of cervix uteri: Secondary | ICD-10-CM | POA: Diagnosis not present

## 2022-01-30 DIAGNOSIS — R87612 Low grade squamous intraepithelial lesion on cytologic smear of cervix (LGSIL): Secondary | ICD-10-CM | POA: Diagnosis not present

## 2022-02-18 NOTE — Progress Notes (Deleted)
I,Arielys Wandersee S Dennisha Mouser,acting as a Education administrator for [name], MD.,have documented all relevant documentation on the behalf of [name], MD,as directed by  [name], MD while in the presence of [name], MD.     Established patient visit   Patient: Dawn Martinez   DOB: 1991-01-20   31 y.o. Female  MRN: [id number] Visit Date: 02/20/2022  Today's healthcare provider: [name], MD   No chief complaint on file.  Subjective    HPI  Anxiety/Depression, Follow-up  She was last seen for anxiety 2 months ago. Changes made at last visit include continue Celexa 40mg . Advised to reach out to a therapist/counselor.   She reports {excellent/good/fair/poor:19665} compliance with treatment. She reports {good/fair/poor:18685} tolerance of treatment. She {is/is not:21021397} having side effects. {document side effects if present:1}  She feels her anxiety is {Desc; severity:60313} and {improved/worse/unchanged:3041574} since last visit.  Symptoms: {Yes/No:20286} chest pain {Yes/No:20286} difficulty concentrating  {Yes/No:20286} dizziness {Yes/No:20286} fatigue  {Yes/No:20286} feelings of losing control {Yes/No:20286} insomnia  {Yes/No:20286} irritable {Yes/No:20286} palpitations  {Yes/No:20286} panic attacks {Yes/No:20286} racing thoughts  {Yes/No:20286} shortness of breath {Yes/No:20286} sweating  {Yes/No:20286} tremors/shakes    GAD-7 Results    12/05/2021    8:40 AM 04/11/2021   10:08 AM 12/10/2018    1:06 PM  GAD-7 Generalized Anxiety Disorder Screening Tool  1. Feeling Nervous, Anxious, or on Edge 3 0 3  2. Not Being Able to Stop or Control Worrying 3 0 3  3. Worrying Too Much About Different Things 3 0 2  4. Trouble Relaxing 2 0 3  5. Being So Restless it's Hard To Sit Still 2 0 1  6. Becoming Easily Annoyed or Irritable 3 1 3   7. Feeling Afraid As If Something Awful Might Happen 3 1 1   Total GAD-7 Score 19 2 16   Difficulty At Work, Home,  or Getting  Along With Others? Very difficult Not difficult at all Extremely difficult    PHQ-9 Scores    12/05/2021    8:39 AM 09/20/2021    8:38 AM 04/11/2021   10:06 AM  PHQ9 SCORE ONLY  PHQ-9 Total Score 11 15 4     --------------------------------------------------------------------------------------------------- Follow up for ADD  The patient was last seen for this 2 months ago. Changes made at last visit include no changes.  She reports {excellent/good/fair/poor:19665} compliance with treatment. She feels that condition is {improved/worse/unchanged:3041574}. She {is/is not:21021397} having side effects. ***  -----------------------------------------------------------------------------------------   Medications: Outpatient Medications Prior to Visit  Medication Sig   citalopram (CELEXA) 40 MG tablet TAKE 1 TABLET BY MOUTH EVERY DAY   citalopram (CELEXA) 40 MG tablet Take 1 tablet by mouth every day   fluticasone (FLONASE) 50 MCG/ACT nasal spray Place 2 sprays into both nostrils daily.   gabapentin (NEURONTIN) 300 MG capsule Take 1 capsule (300 mg total) by mouth at bedtime.   methylphenidate (RITALIN) 5 MG tablet Take 1 tablet (5 mg total) by mouth 2 (two) times daily.   methylphenidate (RITALIN) 5 MG tablet Take 1 tablet (5 mg total) by mouth 2 (two) times daily.   methylphenidate (RITALIN) 5 MG tablet Take 1 tablet (5 mg total) by mouth 2 (two) times daily.   methylphenidate (RITALIN) 5 MG tablet Take 1 tablet (5 mg total) by mouth 2 (two) times daily.   norethindrone-ethinyl estradiol-FE (BLISOVI FE 1/20) 1-20 MG-MCG tablet TAKE 1 TABLET BY MOUTH EVERY DAY   No facility-administered medications prior to visit.    Review of Systems      Objective  There were no vitals taken for this visit. BP Readings from Last 3 Encounters:  12/05/21 114/82  09/20/21 102/75  11/29/20 112/66   Wt Readings from Last 3 Encounters:  12/05/21 155 lb 8 oz (70.5 kg)  09/20/21  155 lb 1.6 oz (70.4 kg)  04/11/21 155 lb (70.3 kg)      Physical Exam  ***  No results found for any visits on 02/20/22.  Assessment & Plan     ***  No follow-ups on file.      {provider attestation***:1}   Shirlee Latch, MD  [facility] [phone] (phone) [phone] (fax)  [facility]

## 2022-02-20 ENCOUNTER — Ambulatory Visit: Payer: 59 | Admitting: Family Medicine

## 2022-02-20 DIAGNOSIS — F3341 Major depressive disorder, recurrent, in partial remission: Secondary | ICD-10-CM

## 2022-02-20 DIAGNOSIS — F988 Other specified behavioral and emotional disorders with onset usually occurring in childhood and adolescence: Secondary | ICD-10-CM

## 2022-02-20 DIAGNOSIS — F411 Generalized anxiety disorder: Secondary | ICD-10-CM

## 2022-02-27 ENCOUNTER — Other Ambulatory Visit: Payer: Self-pay

## 2022-03-12 NOTE — Progress Notes (Unsigned)
I,[name],acting as a scribe for Dawn Paganini, MD.,have documented all relevant documentation on the behalf of Dawn Paganini, MD,as directed by  Dawn Paganini, MD while in the presence of Dawn Paganini, MD.   Established patient visit   Patient: Dawn Martinez   DOB: 1991/03/16   31 y.o. Female  MRN: [id number] Visit Date: 03/13/2022  Today's healthcare provider: [name], MD   Chief Complaint  Patient presents with   Follow-up   Anxiety   Subjective    HPI  Anxiety, Follow-up  She was last seen for anxiety 3 months ago. Changes made at last visit include continue celexa 40mg . Patient advised to reach out for a therapist/counselor. She is looking into this  Sleeping poorly - always tired, wants to lay in bed. Also can't stay asleep - bad dreams.   GAD-7 Results    03/13/2022   10:35 AM 12/05/2021    8:40 AM 04/11/2021   10:08 AM  GAD-7 Generalized Anxiety Disorder Screening Tool  1. Feeling Nervous, Anxious, or on Edge 2 3 0  2. Not Being Able to Stop or Control Worrying 2 3 0  3. Worrying Too Much About Different Things 3 3 0  4. Trouble Relaxing 2 2 0  5. Being So Restless it's Hard To Sit Still 1 2 0  6. Becoming Easily Annoyed or Irritable 2 3 1   7. Feeling Afraid As If Something Awful Might Happen 2 3 1   Total GAD-7 Score 14 19 2   Difficulty At Work, Home, or Getting  Along With Others? Very difficult Very difficult Not difficult at all    PHQ-9 Scores    03/13/2022   10:34 AM 12/05/2021    8:39 AM 09/20/2021    8:38 AM  PHQ9 SCORE ONLY  PHQ-9 Total Score 11 11 15     --------------------------------------------------------------------------------------------------- Follow up for ADD  The patient was last seen for this 3 months ago. Changes made at last visit include no changes, continue Ritalin 5mg  BID.  She reports excellent compliance with treatment. She feels that condition is Improved. She is not having side  effects.  -----------------------------------------------------------------------------------------   Medications: Outpatient Medications Prior to Visit  Medication Sig   citalopram (CELEXA) 40 MG tablet TAKE 1 TABLET BY MOUTH EVERY DAY   citalopram (CELEXA) 40 MG tablet Take 1 tablet by mouth every day   fluticasone (FLONASE) 50 MCG/ACT nasal spray Place 2 sprays into both nostrils daily.   norethindrone-ethinyl estradiol-FE (BLISOVI FE 1/20) 1-20 MG-MCG tablet TAKE 1 TABLET BY MOUTH EVERY DAY   [DISCONTINUED] gabapentin (NEURONTIN) 300 MG capsule Take 1 capsule (300 mg total) by mouth at bedtime.   [DISCONTINUED] methylphenidate (RITALIN) 5 MG tablet Take 1 tablet (5 mg total) by mouth 2 (two) times daily.   [DISCONTINUED] methylphenidate (RITALIN) 5 MG tablet Take 1 tablet (5 mg total) by mouth 2 (two) times daily.   [DISCONTINUED] methylphenidate (RITALIN) 5 MG tablet Take 1 tablet (5 mg total) by mouth 2 (two) times daily.   [DISCONTINUED] methylphenidate (RITALIN) 5 MG tablet Take 1 tablet (5 mg total) by mouth 2 (two) times daily.   No facility-administered medications prior to visit.    Review of Systems per HPI     Objective    BP 105/70 (BP Location: Left Arm, Patient Position: Sitting, Cuff Size: Normal)   Pulse 79   Temp 98.1 F (36.7 C) (Temporal)   Resp 14   Wt 156 lb (70.8 kg)   SpO2 98%  BMI 31.51 kg/m    Physical Exam Vitals reviewed.  Constitutional:      General: She is not in acute distress.    Appearance: Normal appearance. She is well-developed. She is not diaphoretic.  HENT:     Head: Normocephalic and atraumatic.  Eyes:     General: No scleral icterus.    Conjunctiva/sclera: Conjunctivae normal.  Neck:     Thyroid: No thyromegaly.  Cardiovascular:     Rate and Rhythm: Normal rate and regular rhythm.     Pulses: Normal pulses.     Heart sounds: Normal heart sounds. No murmur heard. Pulmonary:     Effort: Pulmonary effort is normal. No  respiratory distress.     Breath sounds: Normal breath sounds. No wheezing, rhonchi or rales.  Musculoskeletal:     Cervical back: Neck supple.     Right lower leg: No edema.     Left lower leg: No edema.  Lymphadenopathy:     Cervical: No cervical adenopathy.  Skin:    General: Skin is warm and dry.     Findings: No rash.  Neurological:     Mental Status: She is alert and oriented to person, place, and time. Mental status is at baseline.  Psychiatric:        Mood and Affect: Mood is anxious and depressed. Affect is flat.        Behavior: Behavior normal.       No results found for any visits on 03/13/22.  Assessment & Plan     Problem List Items Addressed This Visit       Other   GAD (generalized anxiety disorder)    Chronic and uncontrolled Going to be seeking out counseling soon Continue Celexa 40 mg daily She has tried and failed BuSpar, Wellbutrin, Abilify previously Would like to avoid benzos given history of alcohol use disorder Trial of low-dose Lamictal 25 mg daily Follow-up in 1 month and repeat PHQ-9 and GAD-7      MDD (major depressive disorder)    Chronic and uncontrolled Going to be seeking out counseling soon Continue Celexa 40 mg daily Tried and failed Wellbutrin and Abilify as adjuncts previously Trial of low-dose Lamictal as above 25 mg daily Follow-up in 1 month      ADD (attention deficit disorder) - Primary    Chronic and well-controlled Continue Ritalin 5 mg twice daily as prescribed      Alcohol abuse    Sober for more than 2 years Gabapentin is working well Given her difficulty sleeping, we may consider increasing to 600 mg nightly from 300 mg      Relevant Medications   gabapentin (NEURONTIN) 300 MG capsule     Return in about 4 weeks (around 04/10/2022) for MDD/GAD f/u, virtual ok.      I, Dawn Paganini, MD, have reviewed all documentation for this visit. The documentation on 03/13/22 for the exam, diagnosis, procedures,  and orders are all accurate and complete.   Martinez, Dawn Bucy, MD, MPH [facility]

## 2022-03-13 ENCOUNTER — Other Ambulatory Visit: Payer: Self-pay

## 2022-03-13 ENCOUNTER — Ambulatory Visit (INDEPENDENT_AMBULATORY_CARE_PROVIDER_SITE_OTHER): Payer: 59 | Admitting: Family Medicine

## 2022-03-13 ENCOUNTER — Encounter: Payer: Self-pay | Admitting: Family Medicine

## 2022-03-13 VITALS — BP 105/70 | HR 79 | Temp 98.1°F | Resp 14 | Wt 156.0 lb

## 2022-03-13 DIAGNOSIS — F411 Generalized anxiety disorder: Secondary | ICD-10-CM | POA: Diagnosis not present

## 2022-03-13 DIAGNOSIS — F988 Other specified behavioral and emotional disorders with onset usually occurring in childhood and adolescence: Secondary | ICD-10-CM

## 2022-03-13 DIAGNOSIS — F1010 Alcohol abuse, uncomplicated: Secondary | ICD-10-CM | POA: Diagnosis not present

## 2022-03-13 DIAGNOSIS — F3341 Major depressive disorder, recurrent, in partial remission: Secondary | ICD-10-CM | POA: Diagnosis not present

## 2022-03-13 MED ORDER — METHYLPHENIDATE HCL 5 MG PO TABS
5.0000 mg | ORAL_TABLET | Freq: Two times a day (BID) | ORAL | 0 refills | Status: DC
  Filled 2022-03-13: qty 60, 30d supply, fill #0

## 2022-03-13 MED ORDER — GABAPENTIN 300 MG PO CAPS
600.0000 mg | ORAL_CAPSULE | Freq: Every day | ORAL | 3 refills | Status: AC
  Filled 2022-03-13: qty 180, 90d supply, fill #0

## 2022-03-13 MED ORDER — LAMOTRIGINE 25 MG PO TABS
25.0000 mg | ORAL_TABLET | Freq: Every day | ORAL | 1 refills | Status: DC
  Filled 2022-03-13: qty 30, 30d supply, fill #0

## 2022-03-13 NOTE — Assessment & Plan Note (Signed)
Chronic and uncontrolled Going to be seeking out counseling soon Continue Celexa 40 mg daily Tried and failed Wellbutrin and Abilify as adjuncts previously Trial of low-dose Lamictal as above 25 mg daily Follow-up in 1 month

## 2022-03-13 NOTE — Assessment & Plan Note (Signed)
Chronic and well-controlled Continue Ritalin 5 mg twice daily as prescribed

## 2022-03-13 NOTE — Assessment & Plan Note (Signed)
Sober for more than 2 years Gabapentin is working well Given her difficulty sleeping, we may consider increasing to 600 mg nightly from 300 mg

## 2022-03-13 NOTE — Assessment & Plan Note (Signed)
Chronic and uncontrolled Going to be seeking out counseling soon Continue Celexa 40 mg daily She has tried and failed BuSpar, Wellbutrin, Abilify previously Would like to avoid benzos given history of alcohol use disorder Trial of low-dose Lamictal 25 mg daily Follow-up in 1 month and repeat PHQ-9 and GAD-7

## 2022-04-14 ENCOUNTER — Encounter: Payer: Self-pay | Admitting: Family Medicine

## 2022-05-11 DIAGNOSIS — R112 Nausea with vomiting, unspecified: Secondary | ICD-10-CM | POA: Diagnosis not present

## 2022-05-11 DIAGNOSIS — E860 Dehydration: Secondary | ICD-10-CM | POA: Diagnosis not present

## 2022-05-11 DIAGNOSIS — Z20822 Contact with and (suspected) exposure to covid-19: Secondary | ICD-10-CM | POA: Diagnosis not present

## 2022-05-14 ENCOUNTER — Other Ambulatory Visit: Payer: Self-pay

## 2022-05-14 MED ORDER — FLUCONAZOLE 150 MG PO TABS
ORAL_TABLET | ORAL | 0 refills | Status: DC
  Filled 2022-05-14: qty 1, 1d supply, fill #0

## 2022-05-14 MED ORDER — PREDNISONE 10 MG PO TABS
ORAL_TABLET | ORAL | 0 refills | Status: DC
  Filled 2022-05-14: qty 21, 6d supply, fill #0

## 2022-05-14 MED ORDER — AMOXICILLIN-POT CLAVULANATE 875-125 MG PO TABS
1.0000 | ORAL_TABLET | Freq: Two times a day (BID) | ORAL | 0 refills | Status: AC
  Filled 2022-05-14: qty 20, 10d supply, fill #0

## 2022-06-26 ENCOUNTER — Other Ambulatory Visit: Payer: Self-pay

## 2022-06-26 MED ORDER — AZITHROMYCIN 250 MG PO TABS
ORAL_TABLET | ORAL | 0 refills | Status: AC
  Filled 2022-06-26: qty 6, 5d supply, fill #0

## 2022-06-26 MED ORDER — GUAIFENESIN-CODEINE 100-10 MG/5ML PO SOLN
5.0000 mL | Freq: Every evening | ORAL | 0 refills | Status: DC | PRN
  Filled 2022-06-26: qty 50, 10d supply, fill #0

## 2022-06-26 MED ORDER — PREDNISONE 10 MG PO TABS
ORAL_TABLET | ORAL | 0 refills | Status: AC
  Filled 2022-06-26: qty 21, 6d supply, fill #0

## 2022-07-31 DIAGNOSIS — N879 Dysplasia of cervix uteri, unspecified: Secondary | ICD-10-CM | POA: Diagnosis not present

## 2022-08-15 DIAGNOSIS — R87613 High grade squamous intraepithelial lesion on cytologic smear of cervix (HGSIL): Secondary | ICD-10-CM | POA: Diagnosis not present

## 2022-08-26 DIAGNOSIS — R87610 Atypical squamous cells of undetermined significance on cytologic smear of cervix (ASC-US): Secondary | ICD-10-CM | POA: Diagnosis not present

## 2022-08-26 DIAGNOSIS — Z3202 Encounter for pregnancy test, result negative: Secondary | ICD-10-CM | POA: Diagnosis not present

## 2022-12-18 ENCOUNTER — Ambulatory Visit (INDEPENDENT_AMBULATORY_CARE_PROVIDER_SITE_OTHER): Payer: Commercial Managed Care - PPO | Admitting: Family Medicine

## 2022-12-18 DIAGNOSIS — Z91199 Patient's noncompliance with other medical treatment and regimen due to unspecified reason: Secondary | ICD-10-CM

## 2022-12-18 NOTE — Progress Notes (Signed)
No show for appt  - disregard

## 2023-03-11 ENCOUNTER — Other Ambulatory Visit: Payer: Self-pay

## 2023-03-11 MED ORDER — FLULAVAL 0.5 ML IM SUSY
0.5000 mL | PREFILLED_SYRINGE | Freq: Once | INTRAMUSCULAR | 0 refills | Status: AC
  Filled 2023-03-11: qty 0.5, 1d supply, fill #0

## 2023-03-18 DIAGNOSIS — N879 Dysplasia of cervix uteri, unspecified: Secondary | ICD-10-CM | POA: Diagnosis not present

## 2023-03-18 DIAGNOSIS — Z124 Encounter for screening for malignant neoplasm of cervix: Secondary | ICD-10-CM | POA: Diagnosis not present

## 2023-03-18 DIAGNOSIS — Z01419 Encounter for gynecological examination (general) (routine) without abnormal findings: Secondary | ICD-10-CM | POA: Diagnosis not present

## 2023-03-18 DIAGNOSIS — N3010 Interstitial cystitis (chronic) without hematuria: Secondary | ICD-10-CM | POA: Diagnosis not present

## 2023-03-18 DIAGNOSIS — R102 Pelvic and perineal pain: Secondary | ICD-10-CM | POA: Diagnosis not present

## 2023-03-18 DIAGNOSIS — Z6833 Body mass index (BMI) 33.0-33.9, adult: Secondary | ICD-10-CM | POA: Diagnosis not present

## 2023-03-27 ENCOUNTER — Other Ambulatory Visit: Payer: Self-pay | Admitting: Family Medicine

## 2023-03-27 NOTE — Telephone Encounter (Signed)
Requested medication (s) are due for refill today: Yes  Requested medication (s) are on the active medication list: Yes  Last refill:  12/27/21  Future visit scheduled: Yes  Notes to clinic:  Pt. Has appointment.    Requested Prescriptions  Pending Prescriptions Disp Refills   citalopram (CELEXA) 40 MG tablet 90 tablet 1    Sig: Take 1 tablet by mouth every day     Psychiatry:  Antidepressants - SSRI Failed - 03/27/2023  9:45 AM      Failed - Completed PHQ-2 or PHQ-9 in the last 360 days      Failed - Valid encounter within last 6 months    Recent Outpatient Visits           3 months ago No-show for appointment   [place] [facility] Beryle Flock, Marzella Schlein, MD   1 year ago Attention deficit disorder (ADD) without hyperactivity   [place] [facility] [place], Marzella Schlein, MD   1 year ago Annual physical exam   [place] [facility] Alfredia Ferguson, PA-C   1 year ago GAD (generalized anxiety disorder)   [place] [facility] Beryle Flock, Marzella Schlein, MD   1 year ago Recurrent major depressive disorder, in partial remission [facility])   [place] [facility] Bacigalupo, Marzella Schlein, MD       Future Appointments             In 1 month Bacigalupo, Marzella Schlein, MD [facility]

## 2023-03-27 NOTE — Telephone Encounter (Signed)
Medication Refill -  Most Recent Primary Care Visit:  Provider: Erasmo Downer  Department: [facility]  Visit Type: OFFICE VISIT  Date: 03/13/2022  Medication: citalopram (CELEXA) 40 MG   Has the patient contacted their pharmacy? no Pt says its been a while since she had this refill, so thought she had to call in  Is this the correct pharmacy for this prescription? yes If no, delete pharmacy and type the correct one.  This is the patient's preferred pharmacy: yes  [facility] [address] [place] [address] Phone: [phone] Fax: [phone]   Has the prescription been filled recently? no  Is the patient out of the medication? yes  Has the patient been seen for an appointment in the last year OR does the patient have an upcoming appointment? yes  Can we respond through MyChart? yes  Agent: Please be advised that Rx refills may take up to 3 business days. We ask that you follow-up with your pharmacy.

## 2023-03-30 ENCOUNTER — Other Ambulatory Visit: Payer: Self-pay

## 2023-03-30 MED ORDER — CITALOPRAM HYDROBROMIDE 40 MG PO TABS
40.0000 mg | ORAL_TABLET | Freq: Every day | ORAL | 0 refills | Status: DC
  Filled 2023-03-30: qty 30, 30d supply, fill #0
  Filled 2023-03-30: qty 30, fill #0

## 2023-03-31 ENCOUNTER — Other Ambulatory Visit: Payer: Self-pay

## 2023-04-28 DIAGNOSIS — R102 Pelvic and perineal pain: Secondary | ICD-10-CM | POA: Diagnosis not present

## 2023-05-01 ENCOUNTER — Other Ambulatory Visit: Payer: Self-pay

## 2023-05-01 DIAGNOSIS — R102 Pelvic and perineal pain: Secondary | ICD-10-CM | POA: Diagnosis not present

## 2023-05-01 DIAGNOSIS — R3915 Urgency of urination: Secondary | ICD-10-CM | POA: Diagnosis not present

## 2023-05-01 DIAGNOSIS — R350 Frequency of micturition: Secondary | ICD-10-CM | POA: Diagnosis not present

## 2023-05-01 MED ORDER — GEMTESA 75 MG PO TABS
75.0000 mg | ORAL_TABLET | Freq: Every day | ORAL | 3 refills | Status: AC
  Filled 2023-05-01: qty 30, 30d supply, fill #0

## 2023-05-12 ENCOUNTER — Other Ambulatory Visit: Payer: Self-pay

## 2023-05-14 ENCOUNTER — Encounter: Payer: 59 | Admitting: Family Medicine

## 2023-05-26 ENCOUNTER — Encounter: Payer: Self-pay | Admitting: Family Medicine

## 2023-05-26 ENCOUNTER — Other Ambulatory Visit: Payer: Self-pay

## 2023-05-26 ENCOUNTER — Ambulatory Visit (INDEPENDENT_AMBULATORY_CARE_PROVIDER_SITE_OTHER): Payer: 59 | Admitting: Family Medicine

## 2023-05-26 VITALS — BP 125/84 | HR 75 | Wt 168.2 lb

## 2023-05-26 DIAGNOSIS — G8929 Other chronic pain: Secondary | ICD-10-CM | POA: Diagnosis not present

## 2023-05-26 DIAGNOSIS — Z0000 Encounter for general adult medical examination without abnormal findings: Secondary | ICD-10-CM | POA: Diagnosis not present

## 2023-05-26 DIAGNOSIS — F909 Attention-deficit hyperactivity disorder, unspecified type: Secondary | ICD-10-CM | POA: Diagnosis not present

## 2023-05-26 DIAGNOSIS — M797 Fibromyalgia: Secondary | ICD-10-CM | POA: Insufficient documentation

## 2023-05-26 DIAGNOSIS — F411 Generalized anxiety disorder: Secondary | ICD-10-CM | POA: Diagnosis not present

## 2023-05-26 DIAGNOSIS — F3341 Major depressive disorder, recurrent, in partial remission: Secondary | ICD-10-CM

## 2023-05-26 DIAGNOSIS — R102 Pelvic and perineal pain: Secondary | ICD-10-CM

## 2023-05-26 MED ORDER — DULOXETINE HCL 20 MG PO CPEP
20.0000 mg | ORAL_CAPSULE | Freq: Every day | ORAL | 3 refills | Status: DC
  Filled 2023-05-26: qty 30, 30d supply, fill #0

## 2023-05-26 NOTE — Progress Notes (Signed)
 Complete physical exam   Patient: Dawn Martinez   DOB: 06-27-1990   33 y.o. Female  MRN: [id number] Visit Date: 05/26/2023  Today's healthcare provider: Jon Eva, MD   Chief Complaint  Patient presents with   Annual Exam    Last CPE 12/05/2021 Reports recently seen by urology overactive bladder and was advised PT for bladder, gemtesa  and gabapentin  and rheumo advised fibromyalgia dx Diet - General, well balanced Exercise - 3 days a week for at least 15 minutes Feeling - poorly Sleeping - poorly Concerns - medication refills on celexa  and gabapentin    Subjective    Dawn Martinez is a 33 y.o. female who presents today for a complete physical exam.    Discussed the use of AI scribe software for clinical note transcription with the patient, who gave verbal consent to proceed.  History of Present Illness   The patient, with a history of depression, anxiety, and chronic pain, presents for a wellness check. She reports having stopped all her medications (Ritalin , gabapentin , and Celexa ) in an attempt to manage without them. However, she experienced a return of depressive symptoms and has recently restarted Celexa . She also restarted gabapentin  for pelvic pain on the advice of a urologist. She reports chronic pain all over her body, with flare-ups that exacerbate the pain and leave her fatigued. She also reports frequent urination, waking up several times at night to urinate, and pelvic pain. She has been prescribed Gemtesa  by a urologist for these symptoms but has not yet started taking it.        Last depression screening scores    05/26/2023    9:30 AM 03/13/2022   10:34 AM 12/05/2021    8:39 AM  PHQ 2/9 Scores  PHQ - 2 Score 0 3 2  PHQ- 9 Score 8 11 11        05/26/2023    9:31 AM 03/13/2022   10:35 AM 12/05/2021    8:40 AM 04/11/2021   10:08 AM  GAD 7 : Generalized Anxiety Score  Nervous, Anxious, on Edge 1 2 3  0  Control/stop worrying 2 2 3  0  Worry  too much - different things 2 3 3  0  Trouble relaxing 1 2 2  0  Restless 1 1 2  0  Easily annoyed or irritable 1 2 3 1   Afraid - awful might happen 2 2 3 1   Total GAD 7 Score 10 14 19 2   Anxiety Difficulty Somewhat difficult Very difficult Very difficult Not difficult at all     Last fall risk screening    05/26/2023    9:30 AM  Fall Risk   Falls in the past year? 0  Number falls in past yr: 0  Injury with Fall? 0  Risk for fall due to : No Fall Risks  Follow up Falls evaluation completed        Medications: Outpatient Medications Prior to Visit  Medication Sig Note   gabapentin  (NEURONTIN ) 300 MG capsule Take 2 capsules (600 mg total) by mouth at bedtime.    guaiFENesin -codeine  100-10 MG/5ML syrup Take 5 mLs by mouth at bedtime as needed for cough    [DISCONTINUED] citalopram  (CELEXA ) 40 MG tablet TAKE 1 TABLET BY MOUTH EVERY DAY    [DISCONTINUED] citalopram  (CELEXA ) 40 MG tablet Take 1 tablet by mouth once daily.    Vibegron  (GEMTESA ) 75 MG TABS Take 1 tablet (75 mg total) by mouth daily. (Patient not taking: Reported on 05/26/2023) 05/26/2023: Has not  started yet   [DISCONTINUED] fluconazole  (DIFLUCAN ) 150 MG tablet Take 1 tablet by mouth once for antibiotic induced yeast as needed (Patient not taking: Reported on 05/26/2023)    [DISCONTINUED] fluticasone  (FLONASE ) 50 MCG/ACT nasal spray Place 2 sprays into both nostrils daily. (Patient not taking: Reported on 05/26/2023)    [DISCONTINUED] lamoTRIgine  (LAMICTAL ) 25 MG tablet Take 1 tablet (25 mg total) by mouth daily. (Patient not taking: Reported on 05/26/2023)    [DISCONTINUED] methylphenidate  (RITALIN ) 5 MG tablet Take 1 tablet (5 mg total) by mouth 2 (two) times daily. (Patient not taking: Reported on 05/26/2023)    [DISCONTINUED] methylphenidate  (RITALIN ) 5 MG tablet Take 1 tablet (5 mg total) by mouth 2 (two) times daily. (Patient not taking: Reported on 05/26/2023)    [DISCONTINUED] methylphenidate  (RITALIN ) 5 MG tablet Take 1  tablet (5 mg total) by mouth 2 (two) times daily. (Patient not taking: Reported on 05/26/2023)    [DISCONTINUED] methylphenidate  (RITALIN ) 5 MG tablet Take 1 tablet (5 mg total) by mouth 2 (two) times daily. (Patient not taking: Reported on 05/26/2023)    [DISCONTINUED] norethindrone -ethinyl estradiol -FE (BLISOVI  FE 1/20) 1-20 MG-MCG tablet TAKE 1 TABLET BY MOUTH EVERY DAY (Patient not taking: Reported on 05/26/2023)    No facility-administered medications prior to visit.    Review of Systems    Objective    BP 125/84 (BP Location: Left Arm, Patient Position: Sitting, Cuff Size: Normal)   Pulse 75   Wt 168 lb 3.2 oz (76.3 kg)   SpO2 99%   BMI 33.97 kg/m    Physical Exam Vitals reviewed.  Constitutional:      General: She is not in acute distress.    Appearance: Normal appearance. She is well-developed. She is not diaphoretic.  HENT:     Head: Normocephalic and atraumatic.     Right Ear: Tympanic membrane, ear canal and external ear normal.     Left Ear: Tympanic membrane, ear canal and external ear normal.     Nose: Nose normal.     Mouth/Throat:     Mouth: Mucous membranes are moist.     Pharynx: Oropharynx is clear. No oropharyngeal exudate.  Eyes:     General: No scleral icterus.    Conjunctiva/sclera: Conjunctivae normal.     Pupils: Pupils are equal, round, and reactive to light.  Neck:     Thyroid: No thyromegaly.  Cardiovascular:     Rate and Rhythm: Normal rate and regular rhythm.     Heart sounds: Normal heart sounds. No murmur heard. Pulmonary:     Effort: Pulmonary effort is normal. No respiratory distress.     Breath sounds: Normal breath sounds. No wheezing or rales.  Abdominal:     General: There is no distension.     Palpations: Abdomen is soft.     Tenderness: There is no abdominal tenderness.  Musculoskeletal:        General: No deformity.     Cervical back: Neck supple.     Right lower leg: No edema.     Left lower leg: No edema.  Lymphadenopathy:      Cervical: No cervical adenopathy.  Skin:    General: Skin is warm and dry.     Findings: No rash.  Neurological:     Mental Status: She is alert and oriented to person, place, and time. Mental status is at baseline.     Gait: Gait normal.  Psychiatric:        Mood and Affect: Mood normal. Affect  is flat.        Behavior: Behavior normal.        Thought Content: Thought content normal.      No results found for any visits on 05/26/23.  Assessment & Plan    Routine Health Maintenance and Physical Exam  Exercise Activities and Dietary recommendations  Goals   None     Immunization History  Administered Date(s) Administered   DTaP 07/31/1989, 12/11/1989, 07/20/1990, 01/26/1991, 09/12/1994   HIB (PRP-OMP) 07/20/1990, 01/26/1991   Hepatitis B 03/05/2001, 04/02/2001, 10/11/2012   Influenza, Seasonal, Injecte, Preservative Fre 03/11/2023   Influenza,inj,Quad PF,6+ Mos 02/24/2013, 02/21/2014, 01/27/2015   Influenza-Unspecified 01/30/2017, 02/09/2019, 03/11/2022   MMR 01/26/1991, 09/12/1994   OPV 07/31/1989, 12/11/1989, 01/26/1991, 09/12/1994   PFIZER(Purple Top)SARS-COV-2 Vaccination 04/25/2021, 05/16/2021   Td 06/23/2003   Tdap 10/11/2012, 04/04/2019    Health Maintenance  Topic Date Due   COVID-19 Vaccine (3 - 2024-25 season) 01/11/2023   Cervical Cancer Screening (HPV/Pap Cotest)  11/28/2024   DTaP/Tdap/Td (9 - Td or Tdap) 04/03/2029   INFLUENZA VACCINE  Completed   Hepatitis C Screening  Completed   HIV Screening  Completed   HPV VACCINES  Aged Out    Discussed health benefits of physical activity, and encouraged her to engage in regular exercise appropriate for her age and condition.  Problem List Items Addressed This Visit       Other   GAD (generalized anxiety disorder)   Depression and anxiety. Recently restarted Celexa  20 mg. Discussed switching to Cymbalta  for potential pain relief benefits. Patient willing to try Cymbalta  despite previous nausea, with  understanding that Celexa  can be resumed if Cymbalta  is not tolerated. - Discontinue Celexa  - Start Cymbalta  20 mg daily - Follow up in one month to reassess      Relevant Medications   DULoxetine  (CYMBALTA ) 20 MG capsule   MDD (major depressive disorder)   Depression and anxiety. Recently restarted Celexa  20 mg. Discussed switching to Cymbalta  for potential pain relief benefits. Patient willing to try Cymbalta  despite previous nausea, with understanding that Celexa  can be resumed if Cymbalta  is not tolerated. - Discontinue Celexa  - Start Cymbalta  20 mg daily - Follow up in one month to reassess      Relevant Medications   DULoxetine  (CYMBALTA ) 20 MG capsule   ADD (attention deficit disorder)   ADHD, previously on Ritalin . Currently not taking Ritalin . Plan to reassess after stabilizing other medications. Discussed stepwise approach to avoid confusion about medication effects. - Reassess ADHD management in one month      Fibromyalgia   Relevant Medications   DULoxetine  (CYMBALTA ) 20 MG capsule   Chronic pelvic pain in female   Relevant Medications   DULoxetine  (CYMBALTA ) 20 MG capsule   Other Visit Diagnoses       Encounter for annual physical exam    -  Primary   Relevant Orders   TSH   CBC with Differential/Platelet   Comprehensive metabolic panel   Lipid panel          Chronic Pain Chronic pain with flare-ups, including pelvic and lower back pain. History of fibromyalgia and positive ANA test. Previous workup for lupus. Currently on gabapentin . Discussed potential benefits of pelvic floor physical therapy for pelvic pain. Diagnosis of exclusion. - Continue gabapentin  - Start pelvic floor physical therapy  Interstitial Cystitis Recurrent symptoms of interstitial cystitis, including frequent urination and pelvic pain. Symptoms improved during pregnancy but have recurred. Discussed starting Gemtesa  for 30 days to reduce urinary frequency.  Advised to use Gemtesa  savings  card for cost management. - Start Gemtesa  for 30 days - Use Gemtesa  savings card for cost management  General Health Maintenance Routine physical examination. Up to date on flu and tetanus vaccinations. No recent lab work. - Order labs for thyroid, blood counts, kidney and liver function, blood sugar, and cholesterol - Schedule follow-up in one month  Follow-up - Schedule follow-up appointment in one month - Send wellness information through MyChart.       Return in about 4 weeks (around 06/23/2023) for MDD/GAD f/u, ADD f/u, virtual ok.     Jon Eva, MD  [facility] [phone] (phone) [phone] (fax)  [facility]

## 2023-05-26 NOTE — Assessment & Plan Note (Signed)
 Depression and anxiety. Recently restarted Celexa  20 mg. Discussed switching to Cymbalta  for potential pain relief benefits. Patient willing to try Cymbalta  despite previous nausea, with understanding that Celexa  can be resumed if Cymbalta  is not tolerated. - Discontinue Celexa  - Start Cymbalta  20 mg daily - Follow up in one month to reassess

## 2023-05-26 NOTE — Assessment & Plan Note (Signed)
 ADHD, previously on Ritalin. Currently not taking Ritalin. Plan to reassess after stabilizing other medications. Discussed stepwise approach to avoid confusion about medication effects. - Reassess ADHD management in one month

## 2023-05-27 LAB — CBC WITH DIFFERENTIAL/PLATELET
Basophils Absolute: 0 10*3/uL (ref 0.0–0.2)
Basos: 0 %
EOS (ABSOLUTE): 0.2 10*3/uL (ref 0.0–0.4)
Eos: 2 %
Hematocrit: 40.3 % (ref 34.0–46.6)
Hemoglobin: 13.3 g/dL (ref 11.1–15.9)
Immature Grans (Abs): 0 10*3/uL (ref 0.0–0.1)
Immature Granulocytes: 0 %
Lymphocytes Absolute: 3.6 10*3/uL — ABNORMAL HIGH (ref 0.7–3.1)
Lymphs: 37 %
MCH: 29.3 pg (ref 26.6–33.0)
MCHC: 33 g/dL (ref 31.5–35.7)
MCV: 89 fL (ref 79–97)
Monocytes Absolute: 0.7 10*3/uL (ref 0.1–0.9)
Monocytes: 7 %
Neutrophils Absolute: 5.2 10*3/uL (ref 1.4–7.0)
Neutrophils: 54 %
Platelets: 364 10*3/uL (ref 150–450)
RBC: 4.54 x10E6/uL (ref 3.77–5.28)
RDW: 11.9 % (ref 11.7–15.4)
WBC: 9.7 10*3/uL (ref 3.4–10.8)

## 2023-05-27 LAB — COMPREHENSIVE METABOLIC PANEL
ALT: 19 [IU]/L (ref 0–32)
AST: 14 [IU]/L (ref 0–40)
Albumin: 4.3 g/dL (ref 3.9–4.9)
Alkaline Phosphatase: 67 [IU]/L (ref 44–121)
BUN/Creatinine Ratio: 11 (ref 9–23)
BUN: 9 mg/dL (ref 6–20)
Bilirubin Total: 0.3 mg/dL (ref 0.0–1.2)
CO2: 23 mmol/L (ref 20–29)
Calcium: 9.2 mg/dL (ref 8.7–10.2)
Chloride: 104 mmol/L (ref 96–106)
Creatinine, Ser: 0.79 mg/dL (ref 0.57–1.00)
Globulin, Total: 2.1 g/dL (ref 1.5–4.5)
Glucose: 77 mg/dL (ref 70–99)
Potassium: 4.1 mmol/L (ref 3.5–5.2)
Sodium: 140 mmol/L (ref 134–144)
Total Protein: 6.4 g/dL (ref 6.0–8.5)
eGFR: 101 mL/min/{1.73_m2} (ref >59)

## 2023-05-27 LAB — LIPID PANEL
Chol/HDL Ratio: 6.1 {ratio} — ABNORMAL HIGH (ref 0.0–4.4)
Cholesterol, Total: 177 mg/dL (ref 100–199)
HDL: 29 mg/dL — ABNORMAL LOW (ref >39)
LDL Chol Calc (NIH): 117 mg/dL — ABNORMAL HIGH (ref 0–99)
Triglycerides: 175 mg/dL — ABNORMAL HIGH (ref 0–149)
VLDL Cholesterol Cal: 31 mg/dL (ref 5–40)

## 2023-05-27 LAB — TSH: TSH: 1.83 u[IU]/mL (ref 0.450–4.500)

## 2023-05-29 ENCOUNTER — Other Ambulatory Visit: Payer: Self-pay

## 2023-06-04 DIAGNOSIS — M62838 Other muscle spasm: Secondary | ICD-10-CM | POA: Diagnosis not present

## 2023-06-04 DIAGNOSIS — N3010 Interstitial cystitis (chronic) without hematuria: Secondary | ICD-10-CM | POA: Diagnosis not present

## 2023-06-04 DIAGNOSIS — R350 Frequency of micturition: Secondary | ICD-10-CM | POA: Diagnosis not present

## 2023-06-04 DIAGNOSIS — R102 Pelvic and perineal pain: Secondary | ICD-10-CM | POA: Diagnosis not present

## 2023-06-12 ENCOUNTER — Other Ambulatory Visit: Payer: Self-pay

## 2023-06-23 ENCOUNTER — Encounter: Payer: Self-pay | Admitting: Family Medicine

## 2023-06-23 ENCOUNTER — Telehealth: Payer: 59 | Admitting: Family Medicine

## 2023-06-23 ENCOUNTER — Other Ambulatory Visit: Payer: Self-pay

## 2023-06-23 DIAGNOSIS — F411 Generalized anxiety disorder: Secondary | ICD-10-CM | POA: Diagnosis not present

## 2023-06-23 DIAGNOSIS — F329 Major depressive disorder, single episode, unspecified: Secondary | ICD-10-CM

## 2023-06-23 DIAGNOSIS — M797 Fibromyalgia: Secondary | ICD-10-CM | POA: Diagnosis not present

## 2023-06-23 DIAGNOSIS — F3341 Major depressive disorder, recurrent, in partial remission: Secondary | ICD-10-CM

## 2023-06-23 MED ORDER — CITALOPRAM HYDROBROMIDE 40 MG PO TABS
40.0000 mg | ORAL_TABLET | Freq: Every day | ORAL | 3 refills | Status: DC
  Filled 2023-06-23: qty 30, 30d supply, fill #0
  Filled 2023-07-29: qty 30, 30d supply, fill #1
  Filled 2023-09-14 – 2023-10-20 (×2): qty 30, 30d supply, fill #2
  Filled 2024-02-10: qty 30, 30d supply, fill #3

## 2023-06-23 MED ORDER — HYDROXYZINE PAMOATE 25 MG PO CAPS
25.0000 mg | ORAL_CAPSULE | Freq: Three times a day (TID) | ORAL | 1 refills | Status: DC | PRN
  Filled 2023-06-23: qty 30, 10d supply, fill #0
  Filled 2023-07-29: qty 30, 10d supply, fill #1

## 2023-06-23 NOTE — Assessment & Plan Note (Signed)
Reports significant anxiety with symptoms including racing thoughts, trouble relaxing, restlessness, irritability, and constant worry. Anxiety score is 19, indicating severe anxiety. Currently on Celexa 20 mg daily but feels it is insufficient. Hydroxyzine discussed as an as-needed medication for acute anxiety episodes. Hydroxyzine is fast-acting, lasts about 8 hours, and can cause drowsiness. Celexa works daily to build a steady level of medication. - Increase Celexa to 40 mg daily - Prescribe hydroxyzine 25 mg, to be taken up to three times a day as needed - Advise monitoring for drowsiness with hydroxyzine - Follow-up in two months to reassess symptoms and treatment efficacy

## 2023-06-23 NOTE — Progress Notes (Signed)
MyChart Video Visit    Virtual Visit via Video Note   This format is felt to be most appropriate for this patient at this time. Physical exam was limited by quality of the video and audio technology used for the visit.    Patient location: home Provider location: [facility] Persons involved in the visit: patient, provider  I discussed the limitations of evaluation and management by telemedicine and the availability of in person appointments. The patient expressed understanding and agreed to proceed.  Patient: Dawn Martinez   DOB: 1990-08-09   32 y.o. Female  MRN: [id number] Visit Date: 06/23/2023  Today's healthcare provider: Shirlee Latch, MD   No chief complaint on file.  Subjective    HPI   Discussed the use of AI scribe software for clinical note transcription with the patient, who gave verbal consent to proceed.  History of Present Illness   The patient, with a history of anxiety, depression, and fibromyalgia, presents for a follow-up visit. She reports that she did not fill the prescription for Cymbalta 20mg  due to previous stomach upset and vomiting. Instead, she has continued to take Celexa, which she restarted a couple of months ago. She expresses interest in adding hydroxyzine to her regimen, as it is used by family members for anxiety. She describes her current state as more anxious than depressed, with racing thoughts at night and constant worry about her daughter. She also reports feeling "blah" due to working third shift. Despite these symptoms, she has not experienced a panic attack in a long time.          06/23/2023   10:16 AM 05/26/2023    9:30 AM 03/13/2022   10:34 AM 12/05/2021    8:39 AM 09/20/2021    8:38 AM  Depression screen PHQ 2/9  Decreased Interest 0 0 1 1 2   Down, Depressed, Hopeless 0 0 2 1 2   PHQ - 2 Score 0 0 3 2 4   Altered sleeping 3 3 3 3 3   Tired, decreased energy 3 3 3 3 3   Change in appetite 1 1 1 1 1    Feeling bad or failure about yourself  1 0 0 1 1  Trouble concentrating 1 1 1 1 2   Moving slowly or fidgety/restless 0 0 0 0 1  Suicidal thoughts 0 0 0 0 0  PHQ-9 Score 9 8 11 11 15   Difficult doing work/chores Somewhat difficult Very difficult Very difficult Extremely dIfficult Extremely dIfficult       06/23/2023   10:19 AM 05/26/2023    9:31 AM 03/13/2022   10:35 AM 12/05/2021    8:40 AM  GAD 7 : Generalized Anxiety Score  Nervous, Anxious, on Edge 3 1 2 3   Control/stop worrying 3 2 2 3   Worry too much - different things 3 2 3 3   Trouble relaxing 2 1 2 2   Restless 2 1 1 2   Easily annoyed or irritable 3 1 2 3   Afraid - awful might happen 3 2 2 3   Total GAD 7 Score 19 10 14 19   Anxiety Difficulty Extremely difficult Somewhat difficult Very difficult Very difficult       Review of Systems      Objective    There were no vitals taken for this visit.      Physical Exam Constitutional:      General: She is not in acute distress.    Appearance: Normal appearance.  HENT:     Head:  Normocephalic.  Pulmonary:     Effort: Pulmonary effort is normal. No respiratory distress.  Neurological:     Mental Status: She is alert and oriented to person, place, and time. Mental status is at baseline.        Assessment & Plan     Problem List Items Addressed This Visit       Other   GAD (generalized anxiety disorder) - Primary   Reports significant anxiety with symptoms including racing thoughts, trouble relaxing, restlessness, irritability, and constant worry. Anxiety score is 19, indicating severe anxiety. Currently on Celexa 20 mg daily but feels it is insufficient. Hydroxyzine discussed as an as-needed medication for acute anxiety episodes. Hydroxyzine is fast-acting, lasts about 8 hours, and can cause drowsiness. Celexa works daily to build a steady level of medication. - Increase Celexa to 40 mg daily - Prescribe hydroxyzine 25 mg, to be taken up to three times a day  as needed - Advise monitoring for drowsiness with hydroxyzine - Follow-up in two months to reassess symptoms and treatment efficacy      Relevant Medications   citalopram (CELEXA) 40 MG tablet   hydrOXYzine (VISTARIL) 25 MG capsule   MDD (major depressive disorder)   Reports feeling 'blah' and tired, with trouble sleeping and low energy. Depression score is 9, indicating mild to moderate depression. Currently on Celexa 20 mg daily but has not seen significant improvement. Celexa works for both anxiety and depression. - Increase Celexa to 40 mg daily - Reassess depression symptoms at follow-up in two months      Relevant Medications   citalopram (CELEXA) 40 MG tablet   hydrOXYzine (VISTARIL) 25 MG capsule   Fibromyalgia   Previously prescribed Cymbalta for fibromyalgia but discontinued due to gastrointestinal side effects, including stomach pain and vomiting. Reverted to Celexa for anxiety and depression management. - Discontinue Cymbalta - Monitor fibromyalgia symptoms and consider alternative treatments if necessary      Relevant Medications   citalopram (CELEXA) 40 MG tablet       Follow-up - Schedule follow-up appointment in two months - Conduct anxiety and depression questionnaires at follow-up to compare with current scores.       Meds ordered this encounter  Medications   citalopram (CELEXA) 40 MG tablet    Sig: Take 1 tablet (40 mg total) by mouth daily.    Dispense:  30 tablet    Refill:  3   hydrOXYzine (VISTARIL) 25 MG capsule    Sig: Take 1 capsule (25 mg total) by mouth every 8 (eight) hours as needed for anxiety.    Dispense:  30 capsule    Refill:  1     Return in about 2 months (around 08/21/2023) for MDD/GAD f/u, virtual ok.     I discussed the assessment and treatment plan with the patient. The patient was provided an opportunity to ask questions and all were answered. The patient agreed with the plan and demonstrated an understanding of the  instructions.   The patient was advised to call back or seek an in-person evaluation if the symptoms worsen or if the condition fails to improve as anticipated.  Shirlee Latch, MD [facility] [phone] (phone) [phone] (fax)  [facility]

## 2023-06-23 NOTE — Assessment & Plan Note (Signed)
Previously prescribed Cymbalta for fibromyalgia but discontinued due to gastrointestinal side effects, including stomach pain and vomiting. Reverted to Celexa for anxiety and depression management. - Discontinue Cymbalta - Monitor fibromyalgia symptoms and consider alternative treatments if necessary

## 2023-06-23 NOTE — Assessment & Plan Note (Signed)
Reports feeling 'blah' and tired, with trouble sleeping and low energy. Depression score is 9, indicating mild to moderate depression. Currently on Celexa 20 mg daily but has not seen significant improvement. Celexa works for both anxiety and depression. - Increase Celexa to 40 mg daily - Reassess depression symptoms at follow-up in two months

## 2023-08-24 ENCOUNTER — Other Ambulatory Visit: Payer: Self-pay

## 2023-08-24 MED ORDER — MIRABEGRON ER 50 MG PO TB24
50.0000 mg | ORAL_TABLET | Freq: Every day | ORAL | 1 refills | Status: AC
  Filled 2023-08-24: qty 30, 30d supply, fill #0

## 2023-09-07 ENCOUNTER — Other Ambulatory Visit: Payer: Self-pay

## 2023-09-14 ENCOUNTER — Other Ambulatory Visit: Payer: Self-pay | Admitting: Family Medicine

## 2023-09-14 ENCOUNTER — Other Ambulatory Visit: Payer: Self-pay

## 2023-09-14 MED FILL — Hydroxyzine Pamoate Cap 25 MG: ORAL | 10 days supply | Qty: 30 | Fill #0 | Status: CN

## 2023-09-15 ENCOUNTER — Other Ambulatory Visit: Payer: Self-pay

## 2023-09-15 DIAGNOSIS — M533 Sacrococcygeal disorders, not elsewhere classified: Secondary | ICD-10-CM | POA: Diagnosis not present

## 2023-09-24 ENCOUNTER — Other Ambulatory Visit: Payer: Self-pay

## 2023-10-20 ENCOUNTER — Other Ambulatory Visit: Payer: Self-pay

## 2023-10-20 MED FILL — Hydroxyzine Pamoate Cap 25 MG: ORAL | 10 days supply | Qty: 30 | Fill #0 | Status: AC

## 2023-10-30 ENCOUNTER — Other Ambulatory Visit: Payer: Self-pay

## 2023-10-30 MED ORDER — HYDROXYZINE HCL 10 MG PO TABS
10.0000 mg | ORAL_TABLET | Freq: Four times a day (QID) | ORAL | 0 refills | Status: DC | PRN
  Filled 2023-10-30: qty 30, 8d supply, fill #0

## 2023-12-23 ENCOUNTER — Other Ambulatory Visit: Payer: Self-pay

## 2023-12-23 MED ORDER — HYDROXYZINE HCL 10 MG PO TABS
10.0000 mg | ORAL_TABLET | Freq: Four times a day (QID) | ORAL | 0 refills | Status: DC | PRN
  Filled 2023-12-23 – 2024-02-10 (×3): qty 30, 8d supply, fill #0

## 2024-01-04 ENCOUNTER — Other Ambulatory Visit: Payer: Self-pay

## 2024-02-10 ENCOUNTER — Other Ambulatory Visit: Payer: Self-pay

## 2024-02-10 MED FILL — Hydroxyzine Pamoate Cap 25 MG: ORAL | 10 days supply | Qty: 30 | Fill #1 | Status: AC

## 2024-02-16 DIAGNOSIS — M1990 Unspecified osteoarthritis, unspecified site: Secondary | ICD-10-CM | POA: Diagnosis not present

## 2024-04-19 ENCOUNTER — Other Ambulatory Visit: Payer: Self-pay | Admitting: Family Medicine

## 2024-04-19 ENCOUNTER — Other Ambulatory Visit: Payer: Self-pay

## 2024-04-19 MED ORDER — HYDROXYZINE HCL 10 MG PO TABS
10.0000 mg | ORAL_TABLET | Freq: Four times a day (QID) | ORAL | 0 refills | Status: AC | PRN
  Filled 2024-04-19: qty 30, 8d supply, fill #0

## 2024-04-20 ENCOUNTER — Other Ambulatory Visit: Payer: Self-pay

## 2024-04-20 MED ORDER — CITALOPRAM HYDROBROMIDE 40 MG PO TABS
40.0000 mg | ORAL_TABLET | Freq: Every day | ORAL | 3 refills | Status: AC
  Filled 2024-04-20: qty 30, 30d supply, fill #0

## 2024-04-20 NOTE — Telephone Encounter (Signed)
 Please review in provider's absence.  LOV- 06/23/2023 NOV- None LRF- 06/23/2023 Outpatient Medication Detail   Disp Refills Start End   citalopram  (CELEXA ) 40 MG tablet 30 tablet 3 06/23/2023 --   Sig - Route: Take 1 tablet (40 mg total) by mouth daily. - Oral   Sent to pharmacy as: citalopram  (CELEXA ) 40 MG tablet

## 2024-08-09 ENCOUNTER — Encounter: Admitting: Obstetrics
